# Patient Record
Sex: Female | Born: 1975 | Hispanic: Yes | Marital: Married
Health system: Midwestern US, Community
[De-identification: ages and names within clinical notes are randomized; demographics above are authoritative.]

## PROBLEM LIST (undated history)

## (undated) DIAGNOSIS — Z800 Family history of malignant neoplasm of digestive organs: Secondary | ICD-10-CM

## (undated) DIAGNOSIS — D649 Anemia, unspecified: Secondary | ICD-10-CM

## (undated) DIAGNOSIS — Z803 Family history of malignant neoplasm of breast: Secondary | ICD-10-CM

## (undated) DIAGNOSIS — Z8619 Personal history of other infectious and parasitic diseases: Secondary | ICD-10-CM

## (undated) DIAGNOSIS — Z8041 Family history of malignant neoplasm of ovary: Secondary | ICD-10-CM

## (undated) DIAGNOSIS — E039 Hypothyroidism, unspecified: Principal | ICD-10-CM

## (undated) DIAGNOSIS — D509 Iron deficiency anemia, unspecified: Principal | ICD-10-CM

## (undated) HISTORY — DX: Family history of malignant neoplasm of ovary: Z80.41

## (undated) HISTORY — DX: Personal history of other infectious and parasitic diseases: Z86.19

## (undated) HISTORY — PX: NO PAST SURGERIES: SHX2092

## (undated) HISTORY — DX: Anemia, unspecified: D64.9

## (undated) HISTORY — DX: Family history of malignant neoplasm of digestive organs: Z80.0

## (undated) HISTORY — DX: Family history of malignant neoplasm of breast: Z80.3

---

## 2010-02-24 ENCOUNTER — Inpatient Hospital Stay (HOSPITAL_COMMUNITY)
Admission: AD | Admit: 2010-02-24 | Discharge: 2010-02-26 | Payer: Self-pay | Source: Home / Self Care | Attending: Obstetrics & Gynecology | Admitting: Obstetrics & Gynecology

## 2010-05-21 LAB — CBC
HCT: 25.5 % — ABNORMAL LOW (ref 36.0–46.0)
HCT: 30.8 % — ABNORMAL LOW (ref 36.0–46.0)
MCH: 26.5 pg (ref 26.0–34.0)
MCH: 27.5 pg (ref 26.0–34.0)
MCHC: 31.8 g/dL (ref 30.0–36.0)
MCHC: 32.8 g/dL (ref 30.0–36.0)
RDW: 15 % (ref 11.5–15.5)
RDW: 15.1 % (ref 11.5–15.5)

## 2010-05-21 LAB — ABO/RH: ABO/RH(D): O POS

## 2010-05-21 LAB — RPR: RPR Ser Ql: NONREACTIVE

## 2012-07-10 ENCOUNTER — Telehealth: Payer: Self-pay | Admitting: Neurology

## 2012-07-22 ENCOUNTER — Encounter: Payer: Self-pay | Admitting: *Deleted

## 2012-08-06 ENCOUNTER — Ambulatory Visit: Payer: Self-pay | Admitting: Neurology

## 2013-02-08 ENCOUNTER — Encounter: Payer: Self-pay | Admitting: Gastroenterology

## 2013-02-10 ENCOUNTER — Encounter: Payer: Self-pay | Admitting: Gastroenterology

## 2013-02-10 ENCOUNTER — Other Ambulatory Visit (INDEPENDENT_AMBULATORY_CARE_PROVIDER_SITE_OTHER): Payer: 59

## 2013-02-10 ENCOUNTER — Ambulatory Visit (INDEPENDENT_AMBULATORY_CARE_PROVIDER_SITE_OTHER): Payer: 59 | Admitting: Gastroenterology

## 2013-02-10 VITALS — BP 100/66 | HR 78 | Ht 63.0 in | Wt 120.8 lb

## 2013-02-10 DIAGNOSIS — R1013 Epigastric pain: Secondary | ICD-10-CM

## 2013-02-10 DIAGNOSIS — R197 Diarrhea, unspecified: Secondary | ICD-10-CM

## 2013-02-10 DIAGNOSIS — Z800 Family history of malignant neoplasm of digestive organs: Secondary | ICD-10-CM

## 2013-02-10 DIAGNOSIS — K3189 Other diseases of stomach and duodenum: Secondary | ICD-10-CM

## 2013-02-10 DIAGNOSIS — R1033 Periumbilical pain: Secondary | ICD-10-CM

## 2013-02-10 LAB — HEPATIC FUNCTION PANEL
AST: 19 U/L (ref 0–37)
Albumin: 4.1 g/dL (ref 3.5–5.2)
Total Bilirubin: 0.4 mg/dL (ref 0.3–1.2)

## 2013-02-10 LAB — CBC WITH DIFFERENTIAL/PLATELET
Basophils Relative: 0.6 % (ref 0.0–3.0)
Eosinophils Relative: 2.5 % (ref 0.0–5.0)
HCT: 36.5 % (ref 36.0–46.0)
Hemoglobin: 12.2 g/dL (ref 12.0–15.0)
Lymphs Abs: 2.2 10*3/uL (ref 0.7–4.0)
MCV: 84.7 fl (ref 78.0–100.0)
Monocytes Absolute: 0.6 10*3/uL (ref 0.1–1.0)
Monocytes Relative: 6.9 % (ref 3.0–12.0)
Neutro Abs: 5.8 10*3/uL (ref 1.4–7.7)
RBC: 4.31 Mil/uL (ref 3.87–5.11)
WBC: 8.9 10*3/uL (ref 4.5–10.5)

## 2013-02-10 LAB — BASIC METABOLIC PANEL
BUN: 16 mg/dL (ref 6–23)
Calcium: 9.4 mg/dL (ref 8.4–10.5)
GFR: 141.92 mL/min (ref >60.00)
Potassium: 4 mEq/L (ref 3.5–5.1)

## 2013-02-10 MED ORDER — SACCHAROMYCES BOULARDII 250 MG PO CAPS: 250.0000 mg | ORAL_CAPSULE | Freq: Two times a day (BID) | ORAL | Status: DC

## 2013-02-10 MED ORDER — HYOSCYAMINE SULFATE 0.125 MG SL SUBL: SUBLINGUAL_TABLET | SUBLINGUAL | Status: DC

## 2013-02-10 NOTE — Progress Notes (Signed)
    History of Present Illness: This is a 37 year old female who relates a change in bowel habits with looser stools, bloating and mild abdominal cramping since August. She states she was treated with antibiotics for a urinary tract infection in August and her symptoms began after that. She has recently been taking a digestive enzyme which has helped her symptoms. She also has stopped drinking coffee which exacerbated her symptoms. She took Prilosec for several days but it led to dizziness and she discontinued it. Denies weight loss, constipation, change in stool caliber, melena, hematochezia, nausea, vomiting, dysphagia, reflux symptoms, chest pain.  Review of Systems: Pertinent positive and negative review of systems were noted in the above HPI section. All other review of systems were otherwise negative.  Current Medications, Allergies, Past Medical History, Past Surgical History, Family History and Social History were reviewed in [employer] record.  Physical Exam: General: Well developed , well nourished, no acute distress Head: Normocephalic and atraumatic Eyes:  sclerae anicteric, EOMI Ears: Normal auditory acuity Mouth: No deformity or lesions Neck: Supple, no masses or thyromegaly Lungs: Clear throughout to auscultation Heart: Regular rate and rhythm; no murmurs, rubs or bruits Abdomen: Soft, non tender and non distended. No masses, hepatosplenomegaly or hernias noted. Normal Bowel sounds Musculoskeletal: Symmetrical with no gross deformities  Skin: No lesions on visible extremities Pulses:  Normal pulses noted Extremities: No clubbing, cyanosis, edema or deformities noted Neurological: Alert oriented x 4, grossly nonfocal Cervical Nodes:  No significant cervical adenopathy Inguinal Nodes: No significant inguinal adenopathy Psychological:  Alert and cooperative. Normal mood and affect  Assessment and Recommendations:  1. Change in bowel habits, abdominal  bloating, looser stools. Rule out an intestinal infection, celiac disease, IBS. Obtain standard blood work, celiac panel, stool pathogen panel and Hemoccults. Trial of Florastor twice a day and Levsin 1-2 a.c. Return office visit in 10-14 days.  2. Family history of colon cancer, mother at age 57. Begin higher risk screening colonoscopies at age 33.

## 2013-02-10 NOTE — Patient Instructions (Signed)
Your physician has requested that you go to the basement for the following lab work before leaving today: [employer], Celiac panel, GI pathogen panel.   Start Florastor samples one tablet by mouth twice daily until finished.   We have sent the following medications to your pharmacy for you to pick up at your convenience: Levsin.  Follow instructions on Hemoccult cards and mail them back to [place] when finished.   Thank you for choosing me and [facility].  Venita Lick. Pleas Koch., MD., Clementeen Graham

## 2013-02-11 LAB — CELIAC PANEL 10
Gliadin IgA: 5.4 U/mL (ref <20)
Gliadin IgG: 15.6 U/mL (ref <20)
Tissue Transglutaminase Ab, IgA: 10.3 U/mL (ref <20)

## 2013-02-17 ENCOUNTER — Other Ambulatory Visit: Payer: Self-pay | Admitting: Gastroenterology

## 2013-02-17 ENCOUNTER — Other Ambulatory Visit: Payer: 59

## 2013-02-17 DIAGNOSIS — R197 Diarrhea, unspecified: Secondary | ICD-10-CM

## 2013-02-17 DIAGNOSIS — Z800 Family history of malignant neoplasm of digestive organs: Secondary | ICD-10-CM

## 2013-02-18 LAB — GASTROINTESTINAL PATHOGEN PANEL PCR
C. difficile Tox A/B, PCR: NEGATIVE
Campylobacter, PCR: NEGATIVE
E coli (STEC) stx1/stx2, PCR: NEGATIVE
Giardia lamblia, PCR: NEGATIVE
Norovirus, PCR: NEGATIVE
Rotavirus A, PCR: NEGATIVE

## 2013-02-24 ENCOUNTER — Ambulatory Visit: Payer: 59 | Admitting: Gastroenterology

## 2013-03-05 ENCOUNTER — Other Ambulatory Visit (INDEPENDENT_AMBULATORY_CARE_PROVIDER_SITE_OTHER): Payer: 59

## 2013-03-05 DIAGNOSIS — Z800 Family history of malignant neoplasm of digestive organs: Secondary | ICD-10-CM

## 2013-03-05 DIAGNOSIS — R197 Diarrhea, unspecified: Secondary | ICD-10-CM

## 2013-03-05 LAB — HEMOCCULT SLIDES (X 3 CARDS)
OCCULT 1: NEGATIVE
OCCULT 2: NEGATIVE
OCCULT 3: NEGATIVE
OCCULT 4: NEGATIVE

## 2013-03-15 ENCOUNTER — Encounter: Payer: Self-pay | Admitting: Gastroenterology

## 2013-03-15 ENCOUNTER — Ambulatory Visit (INDEPENDENT_AMBULATORY_CARE_PROVIDER_SITE_OTHER): Payer: 59 | Admitting: Gastroenterology

## 2013-03-15 VITALS — BP 100/60 | HR 72 | Ht 63.0 in | Wt 121.2 lb

## 2013-03-15 DIAGNOSIS — R1013 Epigastric pain: Secondary | ICD-10-CM

## 2013-03-15 MED ORDER — PANTOPRAZOLE SODIUM 20 MG PO TBEC: 20.0000 mg | DELAYED_RELEASE_TABLET | Freq: Every day | ORAL | Status: DC

## 2013-03-15 NOTE — Patient Instructions (Signed)
We have sent the following medications to your pharmacy for you to pick up at your convenience:  Pantoprazole  Thank you for choosing me and [facility].  Malcolm T. Stark, Jr., MD., FACG  

## 2013-03-15 NOTE — Progress Notes (Signed)
    History of Present Illness: This is a 38 year old female who has noted improvement in her abdominal bloating, abdominal cramping and looser stools with the use of digestive enzymes and Florastor. Blood work, stool pathogen panel and stool Hemoccults all unremarkable except for a mildly elevated glucose at 121 and a slightly elevated platelet count at 457k. She takes hyoscyamine sparingly as it leads to slight blurriness in her vision and she is concerned it may trigger a migraine. She took omeprazole for 2 days but discontinued it due to feelings of anxiety that she attributed to omeprazole. She relates mild epigastric burning and nausea generally in the mornings but sometimes occurring after meals.  Current Medications, Allergies, Past Medical History, Past Surgical History, Family History and Social History were reviewed in [facility] electronic medical record.  Physical Exam: General: Well developed , well nourished, no acute distress Head: Normocephalic and atraumatic Eyes:  sclerae anicteric, EOMI Ears: Normal auditory acuity Mouth: No deformity or lesions Lungs: Clear throughout to auscultation Heart: Regular rate and rhythm; no murmurs, rubs or bruits Abdomen: Soft, mild epigastric tenderness without rebound or guarding and non distended. No masses, hepatosplenomegaly or hernias noted. Normal Bowel sounds Musculoskeletal: Symmetrical with no gross deformities  Pulses:  Normal pulses noted Extremities: No clubbing, cyanosis, edema or deformities noted Neurological: Alert oriented x 4, grossly nonfocal Psychological:  Alert and cooperative. Normal mood and affect  Assessment and Recommendations:  1. Mild epigastric pain and nausea. Begin pantoprazole 20 mg daily. If she has side effects with pantoprazole will  try another PPI or ranitidine.  2. Mild abdominal cramping, bloating and looser stools have essentially resolved. Continue digestive enzymes and Florastor. Hyoscyamine  when necessary.

## 2013-04-28 ENCOUNTER — Ambulatory Visit: Payer: 59 | Admitting: Gastroenterology

## 2013-05-12 ENCOUNTER — Encounter: Payer: Self-pay | Admitting: Gastroenterology

## 2013-05-12 ENCOUNTER — Ambulatory Visit (INDEPENDENT_AMBULATORY_CARE_PROVIDER_SITE_OTHER): Payer: 59 | Admitting: Gastroenterology

## 2013-05-12 VITALS — BP 90/64 | HR 80 | Ht 63.0 in | Wt 122.0 lb

## 2013-05-12 DIAGNOSIS — R1013 Epigastric pain: Secondary | ICD-10-CM

## 2013-05-12 NOTE — Progress Notes (Signed)
    History of Present Illness: This is a 38 year old female returning for followup of epigastric pain. Her symptoms have substantially improved except for occasional epigastric pain and urgent stools with certain foods. She could not tolerate hyoscyamine due to headaches.  Current Medications, Allergies, Past Medical History, Past Surgical History, Family History and Social History were reviewed in [facility] electronic medical record.  Physical Exam: General: Well developed , well nourished, no acute distress Head: Normocephalic and atraumatic Eyes:  sclerae anicteric, EOMI Ears: Normal auditory acuity Mouth: No deformity or lesions Lungs: Clear throughout to auscultation Heart: Regular rate and rhythm; no murmurs, rubs or bruits Abdomen: Soft, non tender and non distended. No masses, hepatosplenomegaly or hernias noted. Normal Bowel sounds Musculoskeletal: Symmetrical with no gross deformities  Extremities: No clubbing, cyanosis, edema or deformities noted Neurological: Alert oriented x 4, grossly nonfocal Psychological:  Alert and cooperative. Normal mood and affect  Assessment and Recommendations:  1. Mild epigastric pain cramping, bloating, looser stoolsand nausea. All symptoms are substantially improved. Continue pantoprazole 20 mg daily for 2 weeks and then taper to DC as below. Continue OTC digestive enzyme 3 times a day with meals as needed and continue Florastor. DC hyoscyamine as it leads to headaches.

## 2013-05-12 NOTE — Patient Instructions (Signed)
Continue pantoprazole for 2 more weeks, then decrease to every other day x 1 week, then reduce to every 3rd x 1 week, then reduce to every 4th day x 1 week, then discontinue.  Thank you for choosing me and [facility].  Venita LickMalcolm T. Pleas KochStark, Jr., MD., Clementeen GrahamFACG

## 2014-08-05 ENCOUNTER — Other Ambulatory Visit: Payer: Self-pay | Admitting: Neurology

## 2014-08-05 ENCOUNTER — Ambulatory Visit
Admission: RE | Admit: 2014-08-05 | Discharge: 2014-08-05 | Disposition: A | Discharge status: Home / Self Care | Payer: 59 | Source: Ambulatory Visit | Attending: Neurology | Admitting: Neurology

## 2014-08-05 DIAGNOSIS — M542 Cervicalgia: Secondary | ICD-10-CM

## 2014-08-05 IMAGING — CR DG CERVICAL SPINE COMPLETE 4+V
6 series · 6 of 6 positions shown · non-contrast
Comparison: None.

CLINICAL DATA: Skullbase tingling sensation with headaches.
Associated neck pain.

EXAM:
CERVICAL SPINE  4+ VIEWS

[w cervical spine lat (1 of 3)]
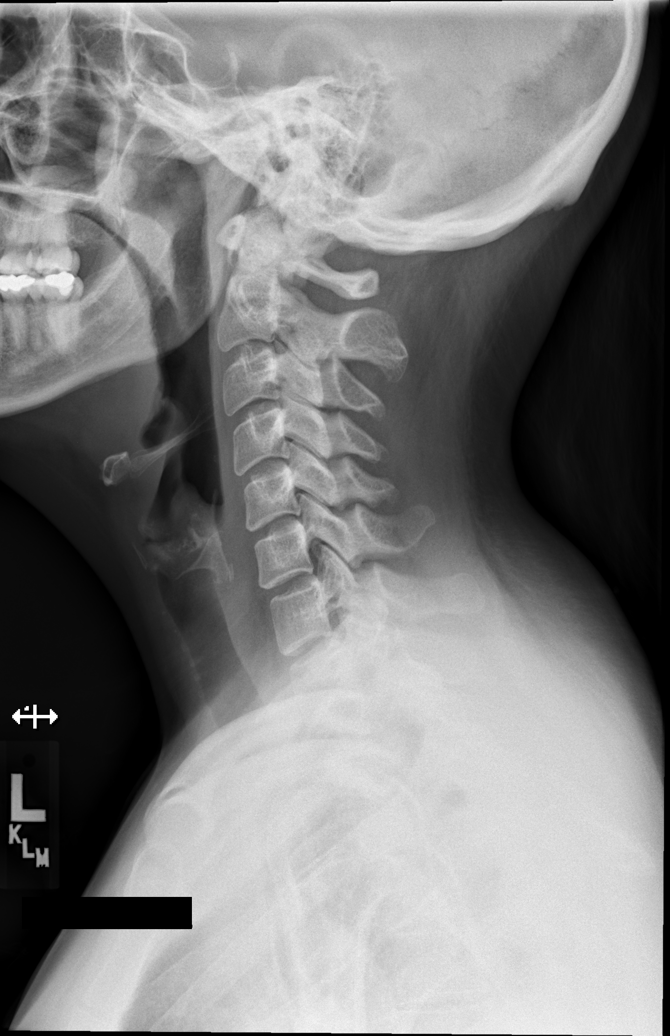

[w cervical spine lat (2 of 3)]
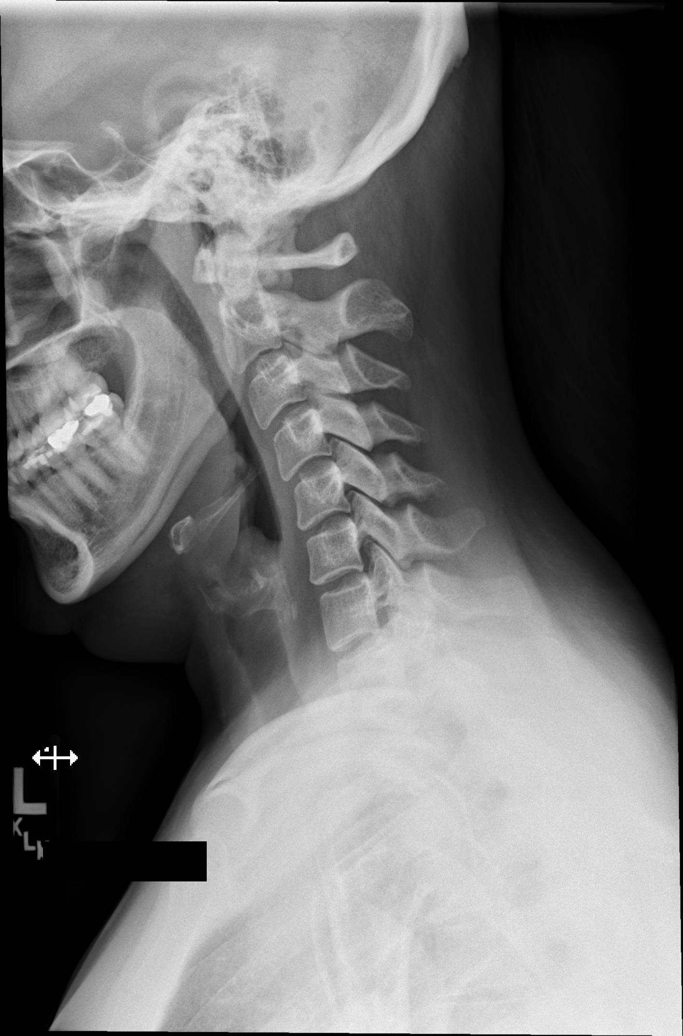

[w cervical spine lat (3 of 3)]
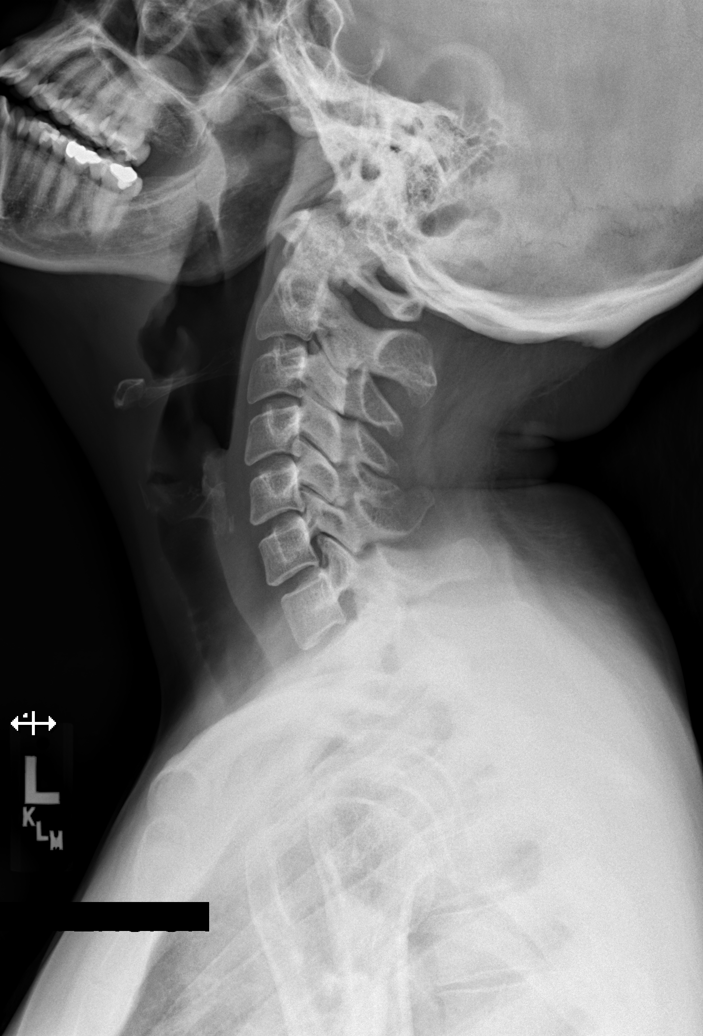

[w cervical spine ap (1 of 2)]
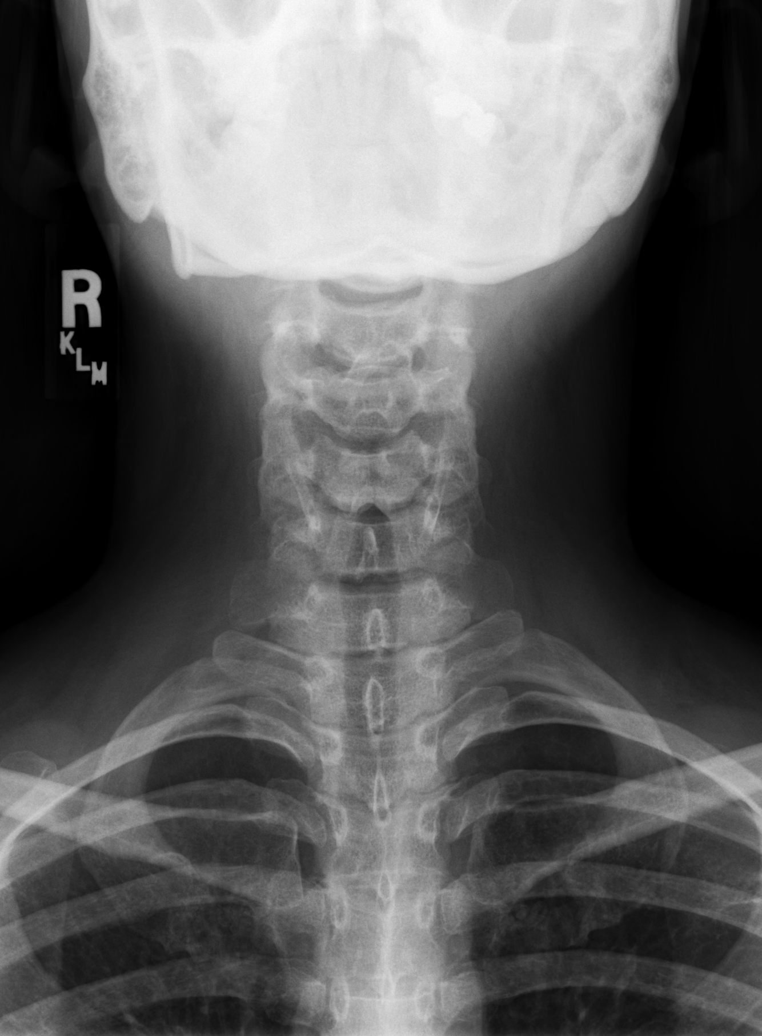

[w cervical spine odontoid]
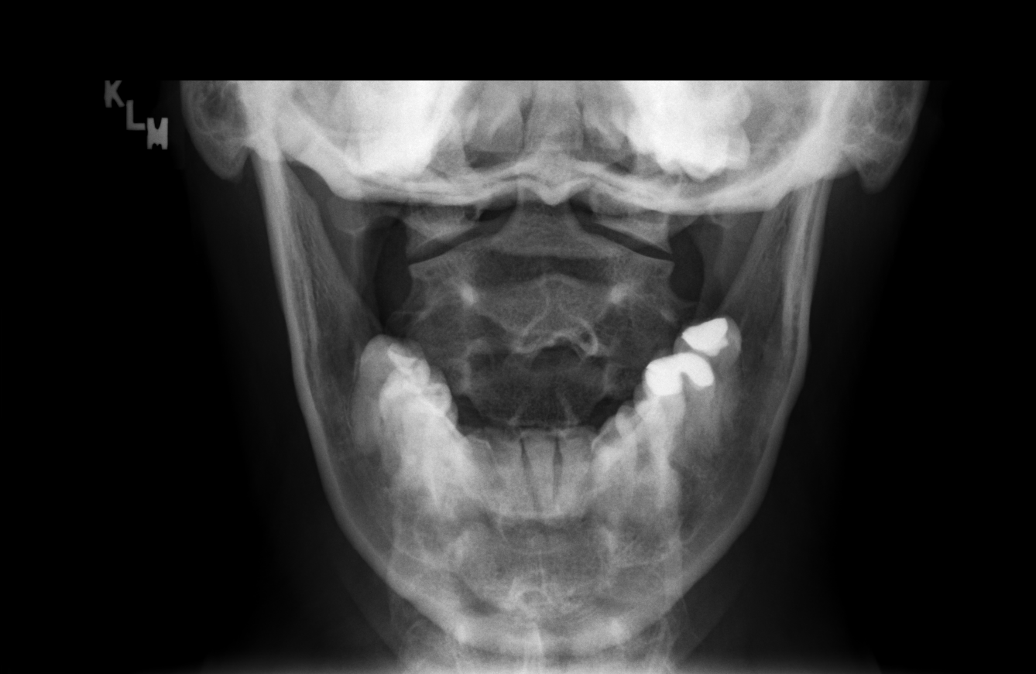

[w cervical spine ap (2 of 2)]
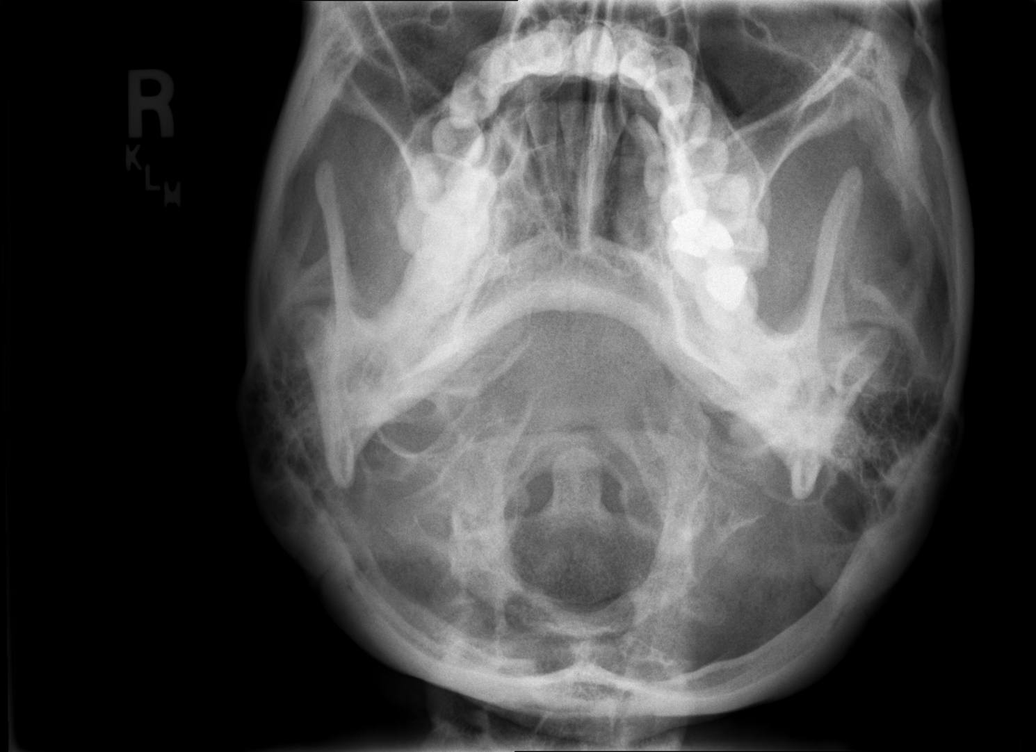

[6 of 6 positions shown; findings below may reference images not displayed]

FINDINGS: There is no evidence of cervical spine fracture or prevertebral soft
tissue swelling. Alignment is normal. No other significant bone
abnormalities are identified. No instability with flexion or
extension.
IMPRESSION: Negative cervical spine radiographs.

## 2015-01-10 ENCOUNTER — Ambulatory Visit
Admission: RE | Admit: 2015-01-10 | Discharge: 2015-01-10 | Disposition: A | Discharge status: Home / Self Care | Payer: 59 | Source: Ambulatory Visit | Attending: Physical Medicine and Rehabilitation | Admitting: Physical Medicine and Rehabilitation

## 2015-01-10 ENCOUNTER — Other Ambulatory Visit: Payer: Self-pay | Admitting: Physical Medicine and Rehabilitation

## 2015-01-10 DIAGNOSIS — M542 Cervicalgia: Secondary | ICD-10-CM

## 2015-01-10 IMAGING — CR DG CERVICAL SPINE 2 OR 3 VIEWS
2 series · 2 of 2 positions shown · non-contrast
Comparison: Cervical spine series of [DATE]

CLINICAL DATA: New onset of posterior neck pain over the past 2
weeks without known injury

EXAM:
CERVICAL SPINE - 2-3 VIEW

[w c-spine lat]
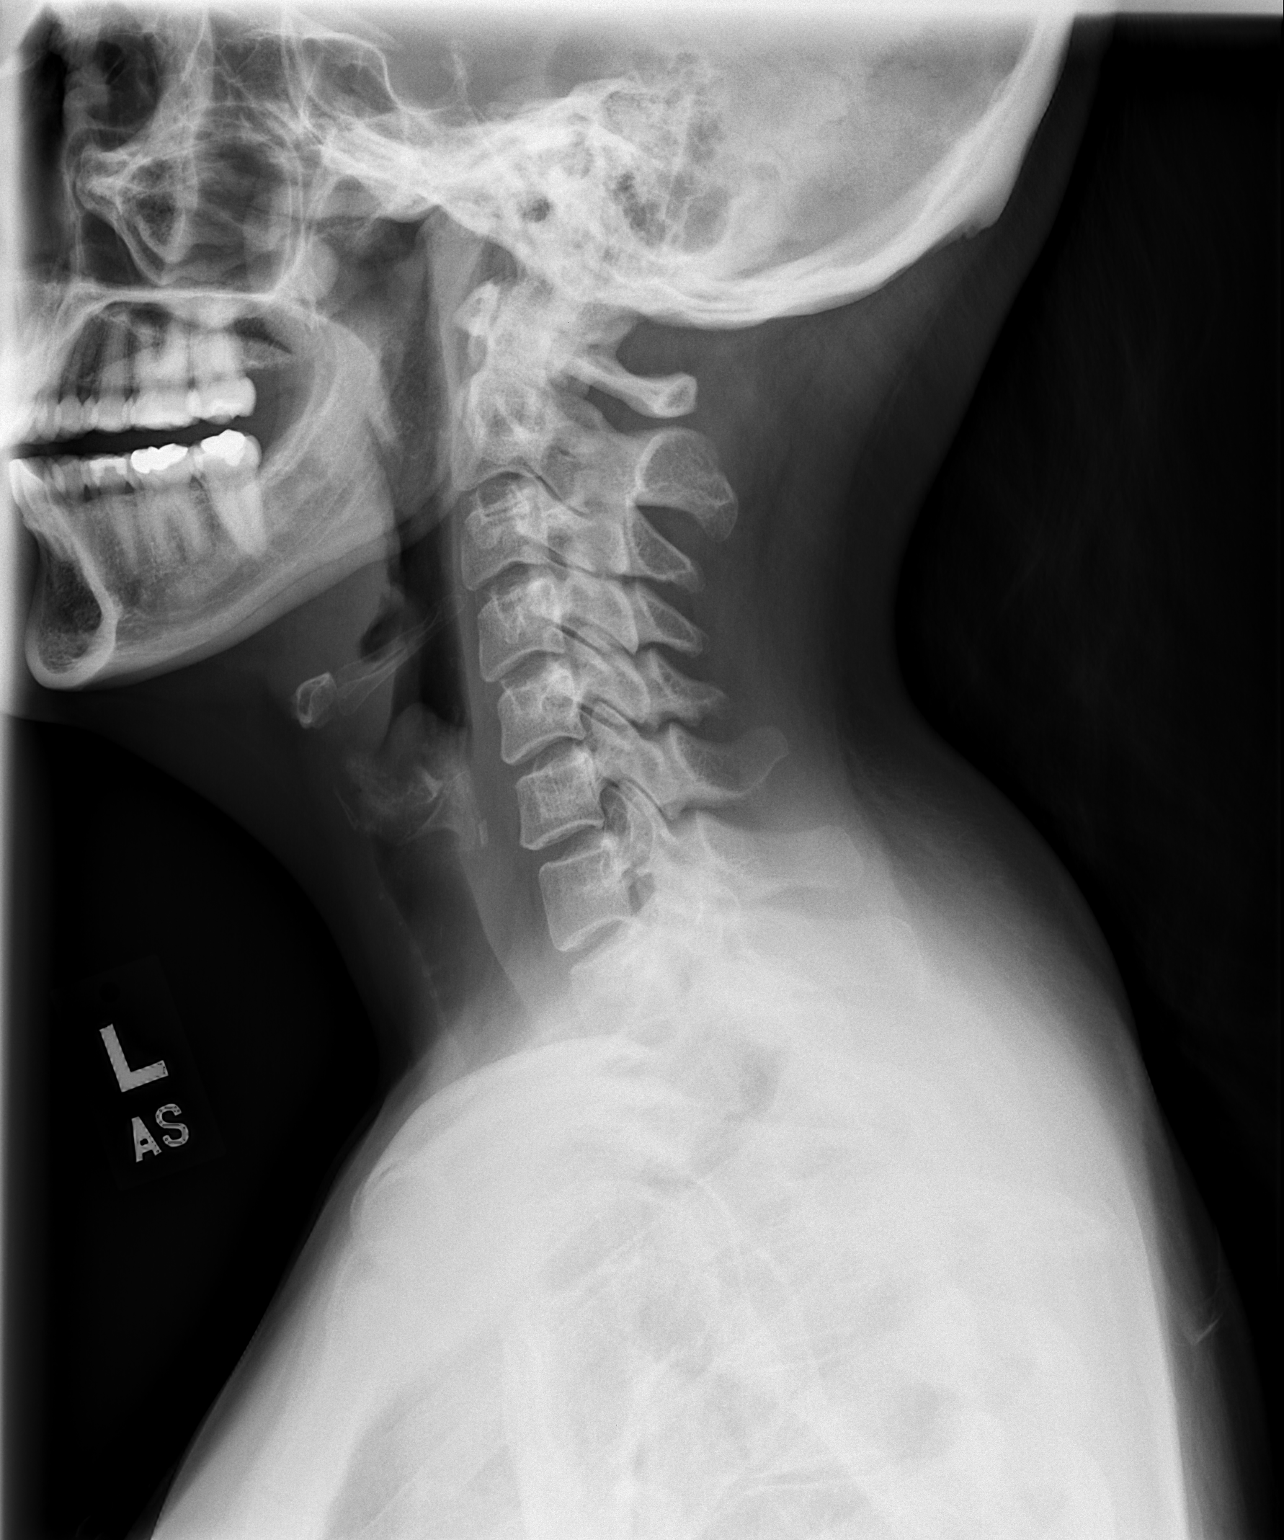

[w c-spine a.p. *]
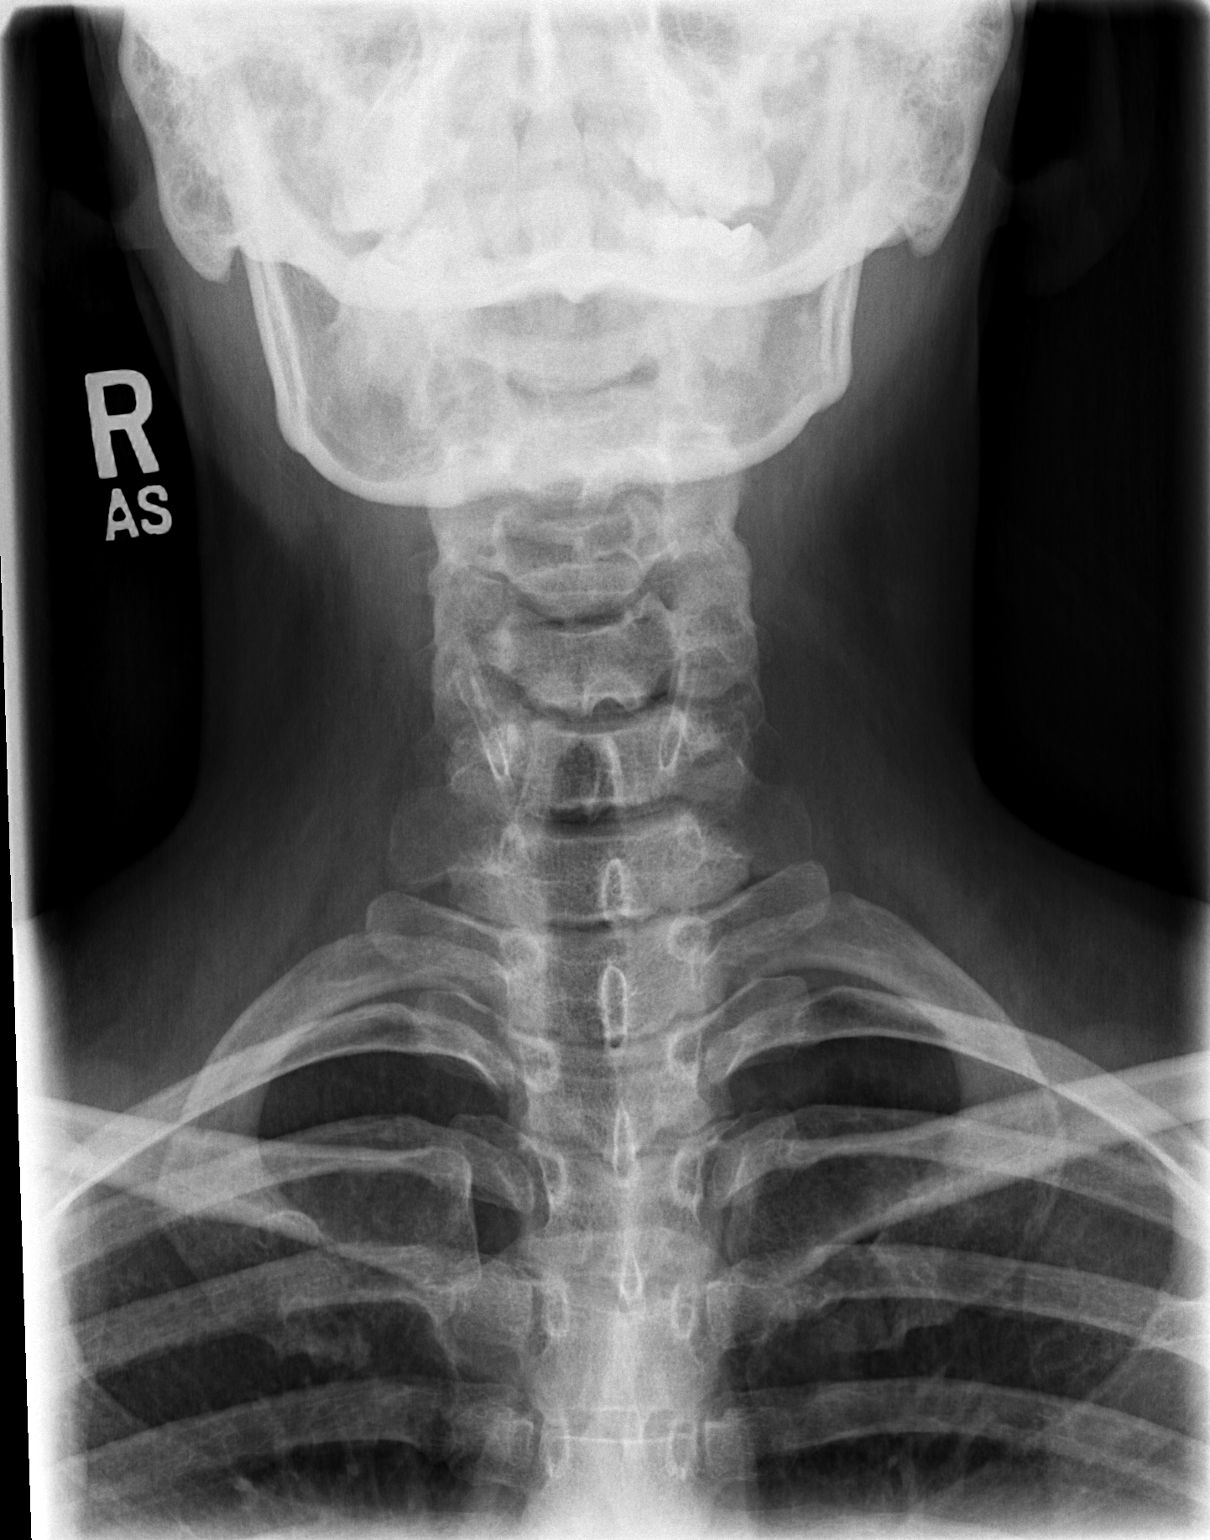

[2 of 2 positions shown; findings below may reference images not displayed]

FINDINGS: The cervical vertebral bodies are preserved in height. The disc
space heights are well maintained. There is no perched facet and no
spinous process fracture. The prevertebral soft tissue spaces are
normal.
IMPRESSION: There is no acute or significant chronic bony abnormality of the
cervical spine.

## 2015-03-21 ENCOUNTER — Ambulatory Visit: Payer: 59 | Admitting: Gastroenterology

## 2015-03-21 ENCOUNTER — Ambulatory Visit (INDEPENDENT_AMBULATORY_CARE_PROVIDER_SITE_OTHER): Payer: 59 | Admitting: Gastroenterology

## 2015-03-21 ENCOUNTER — Encounter: Payer: Self-pay | Admitting: Gastroenterology

## 2015-03-21 VITALS — BP 106/64 | HR 80 | Ht 62.6 in | Wt 125.1 lb

## 2015-03-21 DIAGNOSIS — K219 Gastro-esophageal reflux disease without esophagitis: Secondary | ICD-10-CM

## 2015-03-21 DIAGNOSIS — R0789 Other chest pain: Secondary | ICD-10-CM | POA: Diagnosis not present

## 2015-03-21 DIAGNOSIS — R1013 Epigastric pain: Secondary | ICD-10-CM | POA: Diagnosis not present

## 2015-03-21 DIAGNOSIS — Z800 Family history of malignant neoplasm of digestive organs: Secondary | ICD-10-CM | POA: Diagnosis not present

## 2015-03-21 MED ORDER — OMEPRAZOLE 20 MG PO CPDR: 20.0000 mg | DELAYED_RELEASE_CAPSULE | Freq: Every day | ORAL | Status: DC

## 2015-03-21 NOTE — Progress Notes (Signed)
    History of Present Illness: This is a 40 year old female who relates postprandial heartburn and substernal chest pressure occurring intermittently as well as epigastric discomfort following meals. She also has ongoing problems with left lower chest pain exacerbated by movement and palpation. This pain is present anteriorly, laterally and posteriorly. It also worsens when her postprandial symptoms occur. She had similar GI symptoms when I last saw her that responded to acid supression and a digestive enzyme supplement. Bowel movements are normal. No bleeding, constipation or diarrhea. Appetite is good and weight is stable. She has taken Tums and Zantac as needed with temporary improvement in her symptoms epigastric and substernal chest symptoms.   Current Medications, Allergies, Past Medical History, Past Surgical History, Family History and Social History were reviewed in [facility] electronic medical record.  Physical Exam: General: Well developed, well nourished, no acute distress Head: Normocephalic and atraumatic Eyes:  sclerae anicteric, EOMI Ears: Normal auditory acuity Mouth: No deformity or lesions Lungs: Clear throughout to auscultation. Lower chest wall tender to palpation Heart: Regular rate and rhythm; no murmurs, rubs or bruits Abdomen: Soft, non tender and non distended. No masses, hepatosplenomegaly or hernias noted. Normal Bowel sounds Musculoskeletal: Symmetrical with no gross deformities  Pulses:  Normal pulses noted Extremities: No clubbing, cyanosis, edema or deformities noted Neurological: Alert oriented x 4, grossly nonfocal Psychological:  Alert and cooperative. Normal mood and affect  Assessment and Recommendations:  1. Left chest wall pain and tenderness. Advil leads to dyspeptic symptoms. Tylenol as directed. Follow-up with PCP for further evaluation and mgmt.  2. GERD, epigastric pain and dyspepsia. Begin omeprazole 20 mg daily. Antireflux measures. REV in  one month. If her symptoms do not adequately respond consider abdominal ultrasound and EGD  3. Family history and her mother at age 38. Begin higher risk screening colonoscopies at age 40.

## 2015-03-21 NOTE — Patient Instructions (Signed)
We have sent the following medications to your pharmacy for you to pick up at your convenience: Omeprazole 20 mg daily  You may take over the counter Tylenol as needed for left sided chest pain.  Please follow up with Dr Russella DarStark on Tuesday, 04/25/15 @ 3:45 pm.

## 2015-04-25 ENCOUNTER — Ambulatory Visit: Payer: 59 | Admitting: Gastroenterology

## 2015-06-14 ENCOUNTER — Encounter: Payer: Self-pay | Admitting: Gastroenterology

## 2015-06-14 ENCOUNTER — Ambulatory Visit (INDEPENDENT_AMBULATORY_CARE_PROVIDER_SITE_OTHER): Payer: 59 | Admitting: Gastroenterology

## 2015-06-14 VITALS — BP 90/60 | HR 77 | Ht 63.0 in | Wt 125.0 lb

## 2015-06-14 DIAGNOSIS — R194 Change in bowel habit: Secondary | ICD-10-CM

## 2015-06-14 DIAGNOSIS — K219 Gastro-esophageal reflux disease without esophagitis: Secondary | ICD-10-CM

## 2015-06-14 DIAGNOSIS — R140 Abdominal distension (gaseous): Secondary | ICD-10-CM | POA: Diagnosis not present

## 2015-06-14 MED ORDER — DICYCLOMINE HCL 10 MG PO CAPS: 10.0000 mg | ORAL_CAPSULE | Freq: Two times a day (BID) | ORAL | Status: DC | PRN

## 2015-06-14 NOTE — Patient Instructions (Addendum)
We have sent the following medications to your pharmacy for you to pick up at your convenience:Bentyl.  You can take over the counter Gas-x four times a day as needed for gas and bloating.  Thank you for choosing me and [facility].  Venita LickMalcolm T. Pleas KochStark, Jr., MD., Clementeen GrahamFACG

## 2015-06-14 NOTE — Progress Notes (Signed)
    History of Present Illness: This is a 40 year old female who relates improvement in her reflux symptoms and epigastric pain taking omeprazole and following antireflux measures. She relates postprandial generalized abdominal bloating and gas following her evening meal. She has noted episodes of looser bowel movements occurring 2-3 times a day about a few occasions since I last saw her.  Current Medications, Allergies, Past Medical History, Past Surgical History, Family History and Social History were reviewed in [facility] electronic medical record.  Physical Exam: General: Well developed, well nourished, no acute distress Head: Normocephalic and atraumatic Eyes:  sclerae anicteric, EOMI Ears: Normal auditory acuity Mouth: No deformity or lesions Lungs: Clear throughout to auscultation Heart: Regular rate and rhythm; no murmurs, rubs or bruits Abdomen: Soft, non tender and non distended. No masses, hepatosplenomegaly or hernias noted. Normal Bowel sounds Musculoskeletal: Symmetrical with no gross deformities  Pulses:  Normal pulses noted Extremities: No clubbing, cyanosis, edema or deformities noted Neurological: Alert oriented x 4, grossly nonfocal Psychological:  Alert and cooperative. Normal mood and affect  Assessment and Recommendations:  1. GERD, epigastric pain, bloating, gas, bowel habit variation. Possible IBS. Continue omeprazole 20 mg daily and antireflux measures. Trial of dicyclomine 10 mg tid prn and Gas-X qid prn. If her symptoms do not adequately respond consider abdominal ultrasound and EGD. REV in 2 months.  2. Family history and her mother at age 965. Begin higher risk screening colonoscopies at age 40.

## 2015-08-16 ENCOUNTER — Ambulatory Visit: Payer: 59 | Admitting: Gastroenterology

## 2016-03-14 DIAGNOSIS — M545 Low back pain: Secondary | ICD-10-CM | POA: Diagnosis not present

## 2016-03-19 DIAGNOSIS — M545 Low back pain: Secondary | ICD-10-CM | POA: Diagnosis not present

## 2016-03-21 DIAGNOSIS — M545 Low back pain: Secondary | ICD-10-CM | POA: Diagnosis not present

## 2016-03-26 DIAGNOSIS — M545 Low back pain: Secondary | ICD-10-CM | POA: Diagnosis not present

## 2016-04-02 DIAGNOSIS — M545 Low back pain: Secondary | ICD-10-CM | POA: Diagnosis not present

## 2016-04-04 DIAGNOSIS — D509 Iron deficiency anemia, unspecified: Secondary | ICD-10-CM | POA: Diagnosis not present

## 2016-04-04 DIAGNOSIS — Z0000 Encounter for general adult medical examination without abnormal findings: Secondary | ICD-10-CM | POA: Diagnosis not present

## 2016-04-05 DIAGNOSIS — M545 Low back pain: Secondary | ICD-10-CM | POA: Diagnosis not present

## 2016-04-10 DIAGNOSIS — M545 Low back pain: Secondary | ICD-10-CM | POA: Diagnosis not present

## 2016-04-12 DIAGNOSIS — M545 Low back pain: Secondary | ICD-10-CM | POA: Diagnosis not present

## 2016-04-17 DIAGNOSIS — M545 Low back pain: Secondary | ICD-10-CM | POA: Diagnosis not present

## 2016-04-19 DIAGNOSIS — M545 Low back pain: Secondary | ICD-10-CM | POA: Diagnosis not present

## 2016-05-09 DIAGNOSIS — R899 Unspecified abnormal finding in specimens from other organs, systems and tissues: Secondary | ICD-10-CM | POA: Diagnosis not present

## 2016-05-09 DIAGNOSIS — Z0000 Encounter for general adult medical examination without abnormal findings: Secondary | ICD-10-CM | POA: Diagnosis not present

## 2016-05-09 DIAGNOSIS — D509 Iron deficiency anemia, unspecified: Secondary | ICD-10-CM | POA: Diagnosis not present

## 2016-05-15 DIAGNOSIS — K5900 Constipation, unspecified: Secondary | ICD-10-CM | POA: Diagnosis not present

## 2016-06-20 DIAGNOSIS — S86112A Strain of other muscle(s) and tendon(s) of posterior muscle group at lower leg level, left leg, initial encounter: Secondary | ICD-10-CM | POA: Diagnosis not present

## 2016-08-21 DIAGNOSIS — Z118 Encounter for screening for other infectious and parasitic diseases: Secondary | ICD-10-CM | POA: Diagnosis not present

## 2016-08-21 DIAGNOSIS — Z1159 Encounter for screening for other viral diseases: Secondary | ICD-10-CM | POA: Diagnosis not present

## 2016-08-21 DIAGNOSIS — D649 Anemia, unspecified: Secondary | ICD-10-CM | POA: Diagnosis not present

## 2016-10-03 DIAGNOSIS — K5900 Constipation, unspecified: Secondary | ICD-10-CM | POA: Diagnosis not present

## 2016-12-10 DIAGNOSIS — M7062 Trochanteric bursitis, left hip: Secondary | ICD-10-CM | POA: Diagnosis not present

## 2016-12-10 DIAGNOSIS — M545 Low back pain: Secondary | ICD-10-CM | POA: Diagnosis not present

## 2016-12-10 DIAGNOSIS — M76892 Other specified enthesopathies of left lower limb, excluding foot: Secondary | ICD-10-CM | POA: Diagnosis not present

## 2017-01-16 DIAGNOSIS — Z23 Encounter for immunization: Secondary | ICD-10-CM | POA: Diagnosis not present

## 2017-02-20 DIAGNOSIS — H40013 Open angle with borderline findings, low risk, bilateral: Secondary | ICD-10-CM | POA: Diagnosis not present

## 2017-04-08 DIAGNOSIS — Z0000 Encounter for general adult medical examination without abnormal findings: Secondary | ICD-10-CM | POA: Diagnosis not present

## 2017-04-08 DIAGNOSIS — D509 Iron deficiency anemia, unspecified: Secondary | ICD-10-CM | POA: Diagnosis not present

## 2017-04-15 ENCOUNTER — Encounter: Payer: Self-pay | Admitting: Gastroenterology

## 2017-04-17 ENCOUNTER — Encounter: Payer: Self-pay | Admitting: Gastroenterology

## 2017-04-17 ENCOUNTER — Ambulatory Visit: Payer: 59 | Admitting: Gastroenterology

## 2017-04-17 ENCOUNTER — Encounter (INDEPENDENT_AMBULATORY_CARE_PROVIDER_SITE_OTHER): Payer: Self-pay

## 2017-04-17 VITALS — BP 108/62 | HR 70 | Ht 62.0 in | Wt 119.0 lb

## 2017-04-17 DIAGNOSIS — Z800 Family history of malignant neoplasm of digestive organs: Secondary | ICD-10-CM

## 2017-04-17 DIAGNOSIS — K6289 Other specified diseases of anus and rectum: Secondary | ICD-10-CM

## 2017-04-17 DIAGNOSIS — K5904 Chronic idiopathic constipation: Secondary | ICD-10-CM | POA: Diagnosis not present

## 2017-04-17 MED ORDER — NA SULFATE-K SULFATE-MG SULF 17.5-3.13-1.6 GM/177ML PO SOLN: 1.0000 | Freq: Once | ORAL | 0 refills | Status: AC

## 2017-04-17 NOTE — Progress Notes (Signed)
    History of Present Illness: This is a 42 year old female with rectal swelling, rectal discomfort and occasional urgency.  She states for the past 2-3 months she has a sense of swelling discomfort and something protruding after a bowel movement.   She states for the past several weeks the symptoms have been present constantly.  They temporarily improved with the use of a hydrocortisone rectal cream.  She frequently has small pellet-like stools. She denies rectal bleeding or change in bowel habits.  Her mother was diagnosed with colon cancer in her mid 8060s. Denies weight loss, abdominal pain, constipation, diarrhea, change in stool caliber, melena, hematochezia, nausea, vomiting, dysphagia, reflux symptoms, chest pain.  Current Medications, Allergies, Past Medical History, Past Surgical History, Family History and Social History were reviewed in [facility] electronic medical record.  Physical Exam: General: Well developed, well nourished, no acute distress Head: Normocephalic and atraumatic Eyes:  sclerae anicteric, EOMI Ears: Normal auditory acuity Mouth: No deformity or lesions Lungs: Clear throughout to auscultation Heart: Regular rate and rhythm; no murmurs, rubs or bruits Abdomen: Soft, non tender and non distended. No masses, hepatosplenomegaly or hernias noted. Normal Bowel sounds Rectal: no lesions, mild tenderness, no stool Musculoskeletal: Symmetrical with no gross deformities  Pulses:  Normal pulses noted Extremities: No clubbing, cyanosis, edema or deformities noted Neurological: Alert oriented x 4, grossly nonfocal Psychological:  Alert and cooperative. Normal mood and affect  Assessment and Recommendations:  1.  Rectal swelling and discomfort.  Suspect internal hemorrhoids.  Rule out neoplasms.  Begin Preparation H suppositories nightly for 1 week and then as needed.  Schedule colonoscopy. The risks (including bleeding, perforation, infection, missed lesions, medication  reactions and possible hospitalization or surgery if complications occur), benefits, and alternatives to colonoscopy with possible biopsy and possible polypectomy were discussed with the patient and they consent to proceed.   2. Family history of colon cancer in her mother.  Colonoscopy as above.  3.  Constipation with small pellet-like stools.  Increase dietary fiber and water intake.  If this is not effective begin a stool softener.

## 2017-04-17 NOTE — Patient Instructions (Signed)
Increase your water and fiber intake in your diet.   You can use preparation H suppositories over the counter daily x 1 week then as needed.   You have been scheduled for a colonoscopy. Please follow written instructions given to you at your visit today.  Please pick up your prep supplies at the pharmacy within the next 1-3 days. If you use inhalers (even only as needed), please bring them with you on the day of your procedure. Your physician has requested that you go to www.startemmi.com and enter the access code given to you at your visit today. This web site gives a general overview about your procedure. However, you should still follow specific instructions given to you by our office regarding your preparation for the procedure.  Thank you for choosing me and  [facility].  Venita LickMalcolm T. Pleas KochStark, Jr., MD., Clementeen GrahamFACG

## 2017-05-09 DIAGNOSIS — L298 Other pruritus: Secondary | ICD-10-CM | POA: Diagnosis not present

## 2017-05-22 ENCOUNTER — Telehealth: Payer: Self-pay | Admitting: Gastroenterology

## 2017-05-22 NOTE — Telephone Encounter (Signed)
Tama HighHey John what do you think about patient question?

## 2017-05-23 NOTE — Telephone Encounter (Signed)
Sheri,  [facility]  Thanks,  Cathlyn ParsonsJohn Brelynn Wheller

## 2017-05-30 ENCOUNTER — Ambulatory Visit (AMBULATORY_SURGERY_CENTER): Payer: 59 | Admitting: Gastroenterology

## 2017-05-30 ENCOUNTER — Other Ambulatory Visit: Payer: Self-pay

## 2017-05-30 ENCOUNTER — Encounter: Payer: Self-pay | Admitting: Gastroenterology

## 2017-05-30 VITALS — BP 98/64 | HR 61 | Temp 99.6°F | Resp 10 | Ht 62.0 in | Wt 119.0 lb

## 2017-05-30 DIAGNOSIS — Z800 Family history of malignant neoplasm of digestive organs: Secondary | ICD-10-CM | POA: Diagnosis present

## 2017-05-30 DIAGNOSIS — Z1211 Encounter for screening for malignant neoplasm of colon: Secondary | ICD-10-CM | POA: Diagnosis not present

## 2017-05-30 DIAGNOSIS — Z1212 Encounter for screening for malignant neoplasm of rectum: Secondary | ICD-10-CM | POA: Diagnosis not present

## 2017-05-30 MED ORDER — SODIUM CHLORIDE 0.9 % IV SOLN: 500.0000 mL | Freq: Once | INTRAVENOUS | Status: DC

## 2017-05-30 NOTE — Progress Notes (Signed)
Called to room to assist during endoscopic procedure.  Patient ID and intended procedure confirmed with present staff. Received instructions for my participation in the procedure from the performing physician.  

## 2017-05-30 NOTE — Progress Notes (Signed)
To recovery, report to rn,VSS

## 2017-05-30 NOTE — Patient Instructions (Signed)
Impression/Recommendations:  Repeat colonoscopy in 5 years for screening purposes.  Resume previous diet. Continue present medications.  YOU HAD AN ENDOSCOPIC PROCEDURE TODAY AT THE [facility]:   Refer to the procedure report that was given to you for any specific questions about what was found during the examination.  If the procedure report does not answer your questions, please call your gastroenterologist to clarify.  If you requested that your care partner not be given the details of your procedure findings, then the procedure report has been included in a sealed envelope for you to review at your convenience later.  YOU SHOULD EXPECT: Some feelings of bloating in the abdomen. Passage of more gas than usual.  Walking can help get rid of the air that was put into your GI tract during the procedure and reduce the bloating. If you had a lower endoscopy (such as a colonoscopy or flexible sigmoidoscopy) you may notice spotting of blood in your stool or on the toilet paper. If you underwent a bowel prep for your procedure, you may not have a normal bowel movement for a few days.  Please Note:  You might notice some irritation and congestion in your nose or some drainage.  This is from the oxygen used during your procedure.  There is no need for concern and it should clear up in a day or so.  SYMPTOMS TO REPORT IMMEDIATELY:   Following lower endoscopy (colonoscopy or flexible sigmoidoscopy):  Excessive amounts of blood in the stool  Significant tenderness or worsening of abdominal pains  Swelling of the abdomen that is new, acute  Fever of 100F or higher rologist can be reached at any hour by calling [phone].   DIET:  We do recommend a small meal at first, but then you may proceed to your regular diet.  Drink plenty of fluids but you should avoid alcoholic beverages for 24 hours.  ACTIVITY:  You should plan to take it easy for the rest of today and you should NOT DRIVE  or use heavy machinery until tomorrow (because of the sedation medicines used during the test).    FOLLOW UP: Our staff will call the number listed on your records the next business day following your procedure to check on you and address any questions or concerns that you may have regarding the information given to you following your procedure. If we do not reach you, we will leave a message.  However, if you are feeling well and you are not experiencing any problems, there is no need to return our call.  We will assume that you have returned to your regular daily activities without incident.  If any biopsies were taken you will be contacted by phone or by letter within the next 1-3 weeks.  Please call us at [phone] if you have not heard about the biopsies in 3 weeks.    SIGNATURES/CONFIDENTIALITY: You and/or your care partner have signed paperwork which will be entered into your electronic medical record.  These signatures attest to the fact that that the information above on your After Visit Summary has been reviewed and is understood.  Full responsibility of the confidentiality of this discharge information lies with you and/or your care-partner.

## 2017-05-30 NOTE — Op Note (Signed)
Rebekah Sanchez Patient Name: Rebekah BellingClaudia Sanchez Procedure Date: 05/30/2017 8:05 AM MRN: [id number] Endoscopist: Rebekah DareMalcolm T Rebekah Sanchez , MD Age: [age] Referring MD:  Date of Birth: [birth year] Gender: Female Account #: [id number] Procedure:                Colonoscopy Indications:              Screening in patient at increased risk: Family                            history of 1st-degree relative with colorectal                            cancer Medicines:                Monitored Anesthesia Care Procedure:                Pre-Anesthesia Assessment:                           - Prior to the procedure, a History and Physical                            was performed, and patient medications and                            allergies were reviewed. The patient's tolerance of                            previous anesthesia was also reviewed. The risks                            and benefits of the procedure and the sedation                            options and risks were discussed with the patient.                            All questions were answered, and informed consent                            was obtained. Prior Anticoagulants: The patient has                            taken no previous anticoagulant or antiplatelet                            agents. ASA Grade Assessment: I - A normal, healthy                            patient. After reviewing the risks and benefits,                            the patient was deemed in satisfactory condition to  undergo the procedure.                           After obtaining informed consent, the colonoscope                            was passed under direct vision. Throughout the                            procedure, the patient's blood pressure, pulse, and                            oxygen saturations were monitored continuously. The                            Colonoscope was introduced through the anus and          advanced to the the cecum, identified by                            appendiceal orifice and ileocecal valve. The                            ileocecal valve, appendiceal orifice, and rectum                            were photographed. The quality of the bowel                            preparation was excellent. The patient tolerated                            the procedure well. The colonoscopy was somewhat                            difficult due to significant looping and a tortuous                            colon. Successful completion of the procedure was                            aided by using manual pressure, withdrawing and                            reinserting the scope, withdrawing the scope and                            replacing with the adult colonoscope, straightening                            and shortening the scope to obtain bowel loop                            reduction and using scope torsion. Scope In: 8:16:12 AM Scope Out: 8:28:38 AM Scope Withdrawal  Time: 0 hours 8 minutes 2 seconds  Total Procedure Duration: 0 hours 12 minutes 26 seconds  Findings:                 The perianal and digital rectal examinations were                            normal.                           The entire examined colon appeared normal on direct                            and retroflexion views. Complications:            No immediate complications. Estimated blood loss:                            None. Estimated Blood Loss:     Estimated blood loss: none. Impression:               - The entire examined colon is normal on direct and                            retroflexion views.                           - No specimens collected. Recommendation:           - Repeat colonoscopy in 5 years for screening                            purposes.                           - Patient has a contact number available for                            emergencies. The signs and symptoms of  potential                            delayed complications were discussed with the                            patient. Return to normal activities tomorrow.                            Written discharge instructions were provided to the                            patient.                           - Resume previous diet.                           - Continue present medications. Rebekah Dare, MD 05/30/2017 8:31:37 AM This report has been signed electronically.

## 2017-06-02 ENCOUNTER — Telehealth: Payer: Self-pay

## 2017-06-02 NOTE — Telephone Encounter (Signed)
  Follow up Call-  Call back number 05/30/2017  Post procedure Call Back phone  # [phone]  Permission to leave phone message Yes  Some recent data might be hidden     Patient questions:  Do you have a fever, pain , or abdominal swelling? No. Pain Score  0 *  Have you tolerated food without any problems? Yes.    Have you been able to return to your normal activities? Yes.    Do you have any questions about your discharge instructions: Diet   No. Medications  No. Follow up visit  No.  Do you have questions or concerns about your Care? No.  Actions: * If pain score is 4 or above: No action needed, pain <4.

## 2017-08-25 ENCOUNTER — Encounter: Payer: Self-pay | Admitting: Oncology

## 2017-08-25 DIAGNOSIS — Z130 Encounter for screening for diseases of the blood and blood-forming organs and certain disorders involving the immune mechanism: Secondary | ICD-10-CM | POA: Diagnosis not present

## 2017-08-25 DIAGNOSIS — Z8041 Family history of malignant neoplasm of ovary: Secondary | ICD-10-CM | POA: Diagnosis not present

## 2017-08-25 DIAGNOSIS — Z131 Encounter for screening for diabetes mellitus: Secondary | ICD-10-CM | POA: Diagnosis not present

## 2017-08-25 DIAGNOSIS — Z01419 Encounter for gynecological examination (general) (routine) without abnormal findings: Secondary | ICD-10-CM | POA: Diagnosis not present

## 2017-08-25 DIAGNOSIS — Z803 Family history of malignant neoplasm of breast: Secondary | ICD-10-CM | POA: Diagnosis not present

## 2017-08-25 DIAGNOSIS — Z6821 Body mass index (BMI) 21.0-21.9, adult: Secondary | ICD-10-CM | POA: Diagnosis not present

## 2017-08-25 DIAGNOSIS — Z0000 Encounter for general adult medical examination without abnormal findings: Secondary | ICD-10-CM | POA: Diagnosis not present

## 2017-08-25 DIAGNOSIS — Z13220 Encounter for screening for lipoid disorders: Secondary | ICD-10-CM | POA: Diagnosis not present

## 2017-12-04 DIAGNOSIS — Z23 Encounter for immunization: Secondary | ICD-10-CM | POA: Diagnosis not present

## 2017-12-12 DIAGNOSIS — D508 Other iron deficiency anemias: Secondary | ICD-10-CM | POA: Diagnosis not present

## 2017-12-26 ENCOUNTER — Encounter: Payer: Self-pay | Admitting: Hematology and Oncology

## 2017-12-26 ENCOUNTER — Telehealth: Payer: Self-pay | Admitting: Hematology and Oncology

## 2017-12-26 NOTE — Telephone Encounter (Signed)
New referral received from Jarrett Soho, PA from [facility] for anemia. Pt has been cld and scheduled to see Dr. Caron Presume on 11/1 at 1pm. Pt agreed to the appt date and time. Letter mailed.

## 2017-12-30 DIAGNOSIS — R0789 Other chest pain: Secondary | ICD-10-CM | POA: Diagnosis not present

## 2017-12-30 DIAGNOSIS — M549 Dorsalgia, unspecified: Secondary | ICD-10-CM | POA: Diagnosis not present

## 2018-01-09 ENCOUNTER — Inpatient Hospital Stay: Payer: 59

## 2018-01-09 ENCOUNTER — Encounter: Payer: Self-pay | Admitting: Hematology and Oncology

## 2018-01-09 ENCOUNTER — Inpatient Hospital Stay: Payer: 59 | Attending: Hematology and Oncology | Admitting: Hematology and Oncology

## 2018-01-09 ENCOUNTER — Other Ambulatory Visit: Payer: Self-pay

## 2018-01-09 ENCOUNTER — Encounter: Payer: Self-pay | Admitting: Physical Therapy

## 2018-01-09 ENCOUNTER — Ambulatory Visit: Payer: 59 | Attending: Family Medicine | Admitting: Physical Therapy

## 2018-01-09 VITALS — BP 108/69 | HR 75 | Temp 98.3°F | Resp 18 | Ht 62.0 in | Wt 121.3 lb

## 2018-01-09 DIAGNOSIS — M546 Pain in thoracic spine: Secondary | ICD-10-CM | POA: Insufficient documentation

## 2018-01-09 DIAGNOSIS — D649 Anemia, unspecified: Secondary | ICD-10-CM

## 2018-01-09 DIAGNOSIS — R293 Abnormal posture: Secondary | ICD-10-CM | POA: Diagnosis not present

## 2018-01-09 DIAGNOSIS — D509 Iron deficiency anemia, unspecified: Secondary | ICD-10-CM | POA: Diagnosis not present

## 2018-01-09 DIAGNOSIS — M6281 Muscle weakness (generalized): Secondary | ICD-10-CM | POA: Diagnosis not present

## 2018-01-09 LAB — CBC WITH DIFFERENTIAL (CANCER CENTER ONLY)
Abs Immature Granulocytes: 0.03 10*3/uL (ref 0.00–0.07)
BASOS PCT: 0 %
Basophils Absolute: 0 10*3/uL (ref 0.0–0.1)
EOS ABS: 0.3 10*3/uL (ref 0.0–0.5)
Eosinophils Relative: 3 %
HEMATOCRIT: 37.9 % (ref 36.0–46.0)
Hemoglobin: 12.4 g/dL (ref 12.0–15.0)
IMMATURE GRANULOCYTES: 0 %
LYMPHS ABS: 2.2 10*3/uL (ref 0.7–4.0)
Lymphocytes Relative: 23 %
MCH: 29.5 pg (ref 26.0–34.0)
MCHC: 32.7 g/dL (ref 30.0–36.0)
MCV: 90.2 fL (ref 80.0–100.0)
MONO ABS: 0.7 10*3/uL (ref 0.1–1.0)
MONOS PCT: 8 %
NEUTROS PCT: 66 %
Neutro Abs: 6.1 10*3/uL (ref 1.7–7.7)
PLATELETS: 376 10*3/uL (ref 150–400)
RBC: 4.2 MIL/uL (ref 3.87–5.11)
RDW: 13.5 % (ref 11.5–15.5)
WBC Count: 9.4 10*3/uL (ref 4.0–10.5)
nRBC: 0 % (ref 0.0–0.2)

## 2018-01-09 LAB — RETIC PANEL
IMMATURE RETIC FRACT: 4 % (ref 2.3–15.9)
RBC.: 4.2 MIL/uL (ref 3.87–5.11)
RETIC CT PCT: 1.3 % (ref 0.4–3.1)
RETICULOCYTE HEMOGLOBIN: 35.6 pg (ref >27.9)
Retic Count, Absolute: 55.4 10*3/uL (ref 19.0–186.0)

## 2018-01-09 NOTE — Patient Instructions (Signed)
Access Code: [email or web address]  URL: [email or web address]  Date: 01/09/2018  Prepared by: Morton Peters   Patient Education  Trigger Point Dry Needling

## 2018-01-09 NOTE — Patient Instructions (Signed)
We discussed in detail the results of your previous laboratory studies with Jarrett Soho.  They suggest iron deficiency anemia.  Laboratory studies will be done today to assess any other potential causes of your anemia.  For the time being continue on ferrous sulfate: 325 mg (65 mg elemental iron) once daily.  Barring any unforeseen complications, your next scheduled doctor visit to discuss the results of today's laboratory studies is on November 18.  Please do not hesitate to call should any new or untoward problems arise.  Thank you! Debbe Odea, MD Hematology/Oncology

## 2018-01-09 NOTE — Therapy (Signed)
[facility] [address] [place], [place], [address] Phone: [phone]   Fax:  [phone]  Physical Therapy Evaluation  Patient Details  Name: Rebekah Sanchez MRN: [id number] Date of Birth: 28-Jun-1975 Referring Provider (PT): Via, Caryn Bee, MD   Encounter Date: 01/09/2018  PT End of Session - 01/09/18 1229    Visit Number  1    Date for PT Re-Evaluation  03/06/18    Authorization Type  [employer]    Authorization - Visit Number  75    PT Start Time  1018    PT Stop Time  1105    PT Time Calculation (min)  47 min    Activity Tolerance  Patient tolerated treatment well;Patient limited by pain   Pt reported increased soreness from palpation during evaluation       Past Medical History:  Diagnosis Date  . Family hx of colon cancer    Mother  . History of Helicobacter pylori infection    WHEN SHE WAS 18    History reviewed. No pertinent surgical history.  There were no vitals filed for this visit.   Subjective Assessment - 01/09/18 1020    Subjective  Pt reports history of intermittent episodic upper back and chest pain linked to poor seating over long car trip approx 7 years ago and again approx 3 years ago after pushing heavy cart at [employer].  Current episode began approx 1 month ago following a 2 hour hike carrying a 5# backpack.  Pt described pain as tightenss, soreness, tenderness on spine with shooting tingling up both side of neck into back of head and shortness of breath/pressure in chest.  Pain is worse with weight lifting, using arms on elliptical, pushing, and stress.      Pertinent History  migraines, taking iron for anemia which limits ability to take ibuprofen    Limitations  Sitting;Lifting;House hold activities    How long can you sit comfortably?  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  Yes    Pain Score  5    Can be a 0 with exercise, gets up to a 9/10 when migraine is triggered by pain   Pain Location  Chest    Pain Orientation  Mid;Upper    Pain Descriptors / Indicators  Pressure    Pain Type  Chronic pain    Pain Onset  1 to 4 weeks ago    Pain Frequency  Intermittent    Aggravating Factors   lifting weights, laying on back, prolonged driving, stress    Pain Relieving Factors  exercise, estim machine at home, topical cream, massage    Effect of Pain on Daily Activities  fatigue and increased pain as day progresses    Multiple Pain Sites  Yes    Pain Score  5    Pain Location  Back    Pain Orientation  Upper;Mid    Pain Descriptors / Indicators  Tender;Sore;Aching    Pain Type  Chronic pain    Pain Radiating Towards  up the back of neck bil into head as tingling     Pain Onset  1 to 4 weeks ago    Pain Frequency  Intermittent    Aggravating Factors   lifting weights, stress, prolonged driving, laying on back    Pain Relieving Factors  TENS, exercise    Effect of Pain on Daily Activities  fatigue and poor endurance end of day         [facility] PT Assessment - 01/09/18 0001      Assessment   Medical Diagnosis  R07.89 (ICD-10-CM) - Other chest pain   M54.9,G89.29 (ICD-10-CM) - Upper back pain, chronic   Referring Provider (PT)  Via, Caryn Bee, MD    Onset Date/Surgical Date  12/14/17    Hand Dominance  Right    Next MD Visit  no    Prior Therapy  chiropractic 2 years ago for migraines      Precautions   Precautions  None      Restrictions   Weight Bearing Restrictions  No      Balance Screen   Has the patient fallen in the past 6 months  No    Has the patient had a decrease in activity level because of a fear of falling?   No    Is the patient reluctant to  leave their home because of a fear of falling?   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   Vocation Requirements  driving    Leisure  work out at home on [employer], watch movies      Cognition   Overall Cognitive Status  Within Functional Limits for tasks assessed      Observation/Other Assessments   Focus on Therapeutic Outcomes (FOTO)   next visit      Sensation   Additional Comments  Pt with hyper-reactivity to light touch over C7-T3 spinous processes, bil intercostals T3-T7 along lateral and anterior aspect      Posture/Postural Control   Posture/Postural Control  Postural limitations    Postural Limitations  Forward head;Decreased lumbar lordosis;Decreased thoracic kyphosis    Posture Comments  extension hinge present at C7/T1      ROM / Strength   AROM / PROM / Strength  AROM;Strength      AROM   Overall AROM   Within functional limits for tasks performed    Overall AROM Comments  WFL bil UEs     AROM Assessment Site  Cervical    Cervical Flexion  50    Cervical Extension  30    Cervical - Right Side Bend  15    Cervical - Left Side Bend  15    Cervical - Right Rotation  75    Cervical - Left Rotation  75      Strength   Overall Strength  Deficits    Strength Assessment Site  Cervical;Shoulder;Elbow    Right/Left Shoulder  Right;Left    Right Shoulder Flexion  4-/5    Right Shoulder Extension  4-/5    Right Shoulder ABduction  4-/5    Right Shoulder Internal Rotation  4+/5    Right Shoulder External Rotation  4-/5    Right Shoulder Horizontal ABduction  4-/5    Left Shoulder Flexion  4-/5    Left Shoulder ABduction  4-/5  Left Shoulder Internal Rotation  4+/5    Left Shoulder External Rotation  4-/5    Left Shoulder Horizontal ABduction  4/5    Right/Left Elbow  Right;Left     Right Elbow Flexion  4/5    Right Elbow Extension  4-/5    Left Elbow Flexion  4/5    Left Elbow Extension  4-/5    Cervical Flexion  4+/5    Cervical Extension  4+/5    Cervical - Right Side Bend  4+/5    Cervical - Left Side Bend  4+/5    Cervical - Right Rotation  4+/5    Cervical - Left Rotation  4+/5      Flexibility   Soft Tissue Assessment /Muscle Length  yes   upper traps bil     Palpation   Palpation comment  Pt with hypersensitivity to palpation along cervical, ribcage and upper thoracic musculature                Objective measurements completed on examination: See above findings.              PT Education - 01/09/18 1219    Education Details  Access Code: [email or web address] - dry needling education    Person(s) Educated  Patient    Methods  Explanation;Handout    Comprehension  Verbalized understanding       PT Short Term Goals - 01/09/18 1241      PT SHORT TERM GOAL #1   Title  Pt will be ind in initial HEP    Time  4    Period  Weeks    Status  New    Target Date  02/06/18      PT SHORT TERM GOAL #2   Title  Pt will report improved tolerance of daily tasks with pain rating not to exceed 5/10 at least 3 days a week.    Time  4    Period  Weeks    Status  New    Target Date  02/06/18        PT Long Term Goals - 01/09/18 1242      PT LONG TERM GOAL #1   Title  Pt will be independent in advanced HEP.    Time  8    Period  Weeks    Status  New    Target Date  03/06/18      PT LONG TERM GOAL #2   Title  Pt will report pain < or = 3/10 at least 3 days a week to demonstrate improved tolerance of daily demands of child rearing and house upkeep.    Time  8    Period  Weeks    Status  New    Target Date  03/06/18      PT LONG TERM GOAL #3   Title  Pt will demonstrate cervical ROM to WNL without pain to improve ease while driving.    Time  8    Period  Weeks    Status  New    Target Date  03/06/18      PT LONG TERM GOAL #4   Title   Pt will receive 5/5 manual muscle test scores for all cervical and bil UEs.    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  Latest episode began approx 1 month ago after a 2-hour hike carrying a 5# backpack.  Her pain is in the upper back and comes straight through to her chest causing pain and pressure and intermittent shortness of breath.  She also describes radiating tingling up the back of her neck into head bilaterally.  PT notes poor posture and generalized weakness throughout cervical, thoracic and bilateral UEs.  She has an extension hinge at C7/T1.  She has limited cervical ROM but it is painfree.  She reported increased pain with light palpation of musculature throughout chest, upper back and neck, and on spinous processes of C7-T3.  She will benefit from skilled PT to improve ROM, strength, muscle extensibility and posture which should correspond with increased activity tolerance, independence and endurance during daily tasks with less pain.    History and Personal Factors relevant to plan of care:  history of migraines    Clinical Presentation  Stable    Clinical Presentation due to:  poor posture, pain and weakness of cervico-thoracic region and bil UEs, trigger points posterior neck, upper back, chest    Clinical Decision Making  Low    Rehab Potential  Excellent    PT Frequency  2x / week    PT Duration  8 weeks    PT Treatment/Interventions  ADLs/Self Care Home Management;Cryotherapy;Electrical Stimulation;Moist Heat;Traction;Functional mobility training;Therapeutic activities;Therapeutic exercise;Neuromuscular re-education;Patient/family education;Manual techniques;Passive range of motion;Dry needling;Spinal Manipulations;Joint Manipulations    PT Next  Visit Plan  did Pt call about pain medication option safe to take with iron supplement? Dry needling to upper traps, begin cervico-thoracic strengthening/stabilization    PT Home Exercise Plan  Access Code: [email or web address]       Patient will benefit from skilled therapeutic intervention in order to improve the following deficits and impairments:  Improper body mechanics, Pain, Decreased mobility, Increased muscle spasms, Postural dysfunction, Decreased activity tolerance, Decreased endurance, Decreased range of motion, Decreased strength, Impaired flexibility  Visit Diagnosis: Pain in thoracic spine - Plan: PT plan of care cert/re-cert  Abnormal posture - Plan: PT plan of care cert/re-cert  Muscle weakness (generalized) - Plan: PT plan of care cert/re-cert     Problem List Patient Active Problem List   Diagnosis Date Noted  . Esophageal reflux 03/21/2015  . Family history of colon cancer 03/21/2015    Rebekah Sanchez, PT 01/09/18 12:50 PM   [facility] [address] [place], [place], [address] Phone: [phone]   Fax:  [phone]  Name: Rebekah Sanchez MRN: [id number] Date of Birth: 04/08/1975

## 2018-01-10 LAB — HAPTOGLOBIN: HAPTOGLOBIN: 82 mg/dL (ref 34–200)

## 2018-01-10 NOTE — Progress Notes (Signed)
Outpatient Hematology/Oncology Initial Consultation  Patient Name:  Rebekah Sanchez  DOB: [birth year]   Date of Service: January 09, 2018  Referring Provider: Milagros Evener, MD  Marda Stalker, PA-C [place], [place] [address]   Consulting Physician: Henreitta Leber, MD Hematology/Oncology  Reason for Referral: In the setting of mild anemia secondary to iron deficiency in the premenopausal setting, on oral iron over an extended period, she presents now for further diagnostic and therapeutic recommendations.  History Present Illness: Rebekah Sanchez is a 42 year old resident of [place], originally from [place], whose past medical history is significant for previous nonbleeding internal hemorrhoids; and Helicobacter pylori infection when she was 42 years old.  She is a gravida 3 para 2 miscarriage 1. She continues to menstruate on a monthly basis every 28-29 days without abnormal uterine bleeding. Very recently, she was camping and developed some pain in the mid back which she associates with muscle strain and consequent costochondritis.  It is improving steadily.  She has been on ferrous sulfate: 325 mg once daily beginning March, 2018.  She is alone at this first visit. Her primary care physician is Dr. Milagros Evener. She is also followed by Marda Stalker, physician assistant.  On December 12, 2017: A complete blood count showed hemoglobin 12.4 hematocrit 37.6 MCV 86.0 MCH 28.5 RDW 13.8 WBC 9.8 with 64% neutrophils 26% lymphocytes 8% monocytes 2% eosinophils; platelets 443,000.  Serum iron 60; iron saturation 18%; ferritin 22.  A comprehensive metabolic panel was normal.  On May 09, 2016: A complete blood count showed hemoglobin 11.5 hematocrit 34.1 MCV 83.9 MCH 28.2 WBC 7.9; platelets 352,000.  Serum iron 55; TIBC 418; iron saturation 13%.  Her folic acid was 12.8; vitamin B12 321.  She has no diabetes mellitus, hypertension, or coronary artery disease.   She has no cardiac dysrhythmia.  She denies seizure disorder or stroke syndrome.  She has no thyroid disease.  There is no peptic ulcer or persistent gastroesophageal reflux disease.  She has no viral hepatitis, inflammatory bowel disease, or symptomatic diverticulosis.  She did have a screening colonoscopy in March 2019 because her mother had colon cancer at the age of 42 years.  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  She reports no rheumatoid or gouty arthritis.  She has no peripheral arterial venous thromboembolic disease. Her energy level is stable and consistent with her baseline. She has occasional "migraine headaches," sometimes 1-2 times monthly, generally precipitated by her menstruation.  Rebekah Sanchez denies any appetite or weight deficit. She has no visual changes or hearing problem.   She denies any unusual headache, dizziness, lightheadedness, syncope, or near syncopal episode.  She has no rash or itching.  There is no cough, sore throat, or orthopnea.  She has no dyspnea either at rest or on exertion.  She denies pain or difficulty in swallowing.  No fever, shaking chills, sweats, or flulike symptoms are evident.  She has no heartburn or indigestion.  There is no nausea, vomiting, diarrhea, or constipation.  She has no melena or bright red blood per rectum.  There is no urinary frequency, urgency, hematuria, or dysuria.  Her pain in the mid back and ribs is improving following a outdoor camping trip with heavy backpack.  She has no bleeding tendency or easy bruisability. She has no numbness or tingling in her fingers or toes.  It is with this background she presents now for further diagnostic and therapeutic recommendations in the setting of mild  anemia as outlined above.  Past Medical History:  Diagnosis Date  . Anemia   . Family hx of colon cancer    Mother  . History of Helicobacter pylori infection    WHEN SHE WAS 18   Surgical  History: None  Gynecologic History: Her menarche was at age 63 years. Her last menstruation was on October 1. She is a gravida 3 para 3 miscarriage 1. Her first pregnancy was at age 39 years. Her last pregnancy was at age 81 years. She has never taken oral contraceptives. Her last screening mammogram was in May 2019 Her last Pap smear was in May 2019.  Family History  Problem Relation Age of Onset  . Colon cancer Mother    Social History   Socioeconomic History  . Marital status: Married    Spouse name: Not on file  . Number of children: Not on file  . Years of education: Not on file  . Highest education level: Not on file  Occupational History  . Not on file  Social Needs  . Financial resource strain: Not on file  . Food insecurity:    Worry: Not on file    Inability: Not on file  . Transportation needs:    Medical: Not on file    Non-medical: Not on file  Tobacco Use  . Smoking status: Never Smoker  . Smokeless tobacco: Never Used  Substance and Sexual Activity  . Alcohol use: No  . Drug use: No  . Sexual activity: Not on file  Lifestyle  . Physical activity:    Days per week: Not on file    Minutes per session: Not on file  . Stress: Not on file  Relationships  . Social connections:    Talks on phone: Not on file    Gets together: Not on file    Attends religious service: Not on file    Active member of club or organization: Not on file    Attends meetings of clubs or organizations: Not on file    Relationship status: Not on file  . Intimate partner violence:    Fear of current or ex partner: Not on file    Emotionally abused: Not on file    Physically abused: Not on file    Forced sexual activity: Not on file  Other Topics Concern  . Not on file  Social History Narrative  . Not on file  Mother: Age 69 years: Colon cancer at age 39 years. Father: Deceased: Age 25 years: Complications of alcohol dependence and liver disease. Brothers (2):  HTN Sisters (1): Hypothyroid disease  Transfusion History: No prior transfusion  Exposure History: She has no known exposure to toxic chemicals, radiation, or pesticides.  Allergies  Allergen Reactions  . Hyoscyamine Sulfate     Headaches  Both apples, peaches, plums, and pears cause itchiness and rhinorrhea. She has nonspecific seasonal allergies  Current Outpatient Medications on File Prior to Visit  Medication Sig  . ferrous sulfate 325 (65 FE) MG tablet Take by mouth daily with breakfast.  . Multiple Vitamin (MULTIVITAMIN) tablet Take 1 tablet by mouth daily.   Current Facility-Administered Medications on File Prior to Visit  Medication  . 0.9 %  sodium chloride infusion    Review of Systems: Constitutional: No fever, sweats, or shaking chills.  No appetite or weight deficit.  Energy level stable. Skin: No rash, scaling, sores, lumps, or jaundice. HEENT: No visual changes or hearing deficit. Pulmonary: No unusual cough, sore throat,  or orthopnea. Breasts: No complaints. Cardiovascular: No coronary artery disease, angina, or myocardial infarction. No cardiac dysrhythmia, essential hypertension, or dyslipidemia. Gastrointestinal: No indigestion, dysphagia, abdominal pain, diarrhea, or constipation.  No change in bowel habits; occasional GERD. Genitourinary: No urinary frequency, urgency, hematuria, or dysuria. Musculoskeletal: No arthralgias or myalgias; costochondritis now improving; no joint swelling, pain, or instability. Hematologic: No bleeding tendency or easy bruisability. Endocrine: No intolerance to hot or cold; no thyroid disease or diabetes mellitus. Vascular: No peripheral arterial or venous thromboembolic disease. Psychological: No anxiety, depression, or mood changes; no mental health illnesses. Neurological: No dizziness, lightheadedness, syncope, or near syncopal episodes; no numbness or tingling in the fingers or toes.  Physical Examination: Vital Signs:  Body surface area is 1.55 meters squared.  Vitals:   01/09/18 1320  BP: 108/69  Pulse: 75  Resp: 18  Temp: 98.3 F (36.8 C)  SpO2: 94%    Filed Weights   01/09/18 1320  Weight: 121 lb 4.8 oz (55 kg)  ECOG PERFORMANCE STATUS: 0 Constitutional:  Rebekah Sanchez is a fully nourished and developed Latino. She looks age appropriate.  She is friendly and cooperative without respiratory compromise at rest. Skin: No rashes, scaling, dryness, jaundice, or itching. HEENT: Head is normocephalic and atraumatic.  Pupils are equal round and reactive to light and accommodation.  Sclerae are anicteric.  Conjunctivae are pink.  No sinus tenderness nor oropharyngeal lesions.  Lips without cracking or peeling; tongue without mass, inflammation, or nodularity.  Mucous membranes are moist. Neck: Supple and symmetric.  No jugular venous distention or thyromegaly.  Trachea is midline. Lymphatics: No cervical or supraclavicular lymphadenopathy.  No epitrochlear, axillary, or inguinal lymphadenopathy is appreciated. Respiratory/chest: Thorax is symmetrical.  Breath sounds are clear to auscultation and percussion.  Normal excursion and respiratory effort. Back: Symmetric without deformity or tenderness. Cardiovascular: Heart rate and rhythm are regular without murmurs. Gastrointestinal: Abdomen is soft, nontender; no organomegaly.  Bowel sounds are normoactive.  No masses are appreciated. Extremities: In the lower extremities, there is no asymmetric swelling, erythema, tenderness, or cord formation.  No clubbing, cyanosis, nor edema. Hematologic: No petechiae, hematomas, or ecchymoses. Psychological:  She is oriented to person, place, and time; normal affect, memory, and cognition. Neurological: There are no gross neurologic deficits.  Laboratory Results: I have reviewed the data as listed: January 09, 2018  Ref Range & Units 1d ago (01/09/18) 16yrago (02/10/13) 744yrgo (02/25/10) 7y61yro (02/24/10)  WBC  Count 4.0 - 10.5 K/uL 9.4  8.9 R 12.9High   12.2High    RBC 3.87 - 5.11 MIL/uL 4.20  4.31 R 3.06Low   3.67Low    Hemoglobin 12.0 - 15.0 g/dL 12.4  12.2  8.1 DELTA CHECK NOTED REPEATED TO VERIFYLow   10.1Low    HCT 36.0 - 46.0 % 37.9  36.5  25.5Low   30.8Low    MCV 80.0 - 100.0 fL 90.2  84.7 R 83.3 R 83.9 R  MCH 26.0 - 34.0 pg 29.5   26.5  27.5   MCHC 30.0 - 36.0 g/dL 32.7  33.5  31.8  32.8   RDW 11.5 - 15.5 % 13.5  13.6 R 15.1  15.0   Platelet Count 150 - 400 K/uL 376  457.0High  R 268  317   nRBC 0.0 - 0.2 % 0.0      Neutrophils Relative % % 66  65.1 R    Neutro Abs 1.7 - 7.7 K/uL 6.1  5.8 R    Lymphocytes Relative %  23  24.9 R    Lymphs Abs 0.7 - 4.0 K/uL 2.2  2.2     Monocytes Relative % 8  6.9 R    Monocytes Absolute 0.1 - 1.0 K/uL 0.7  0.6     Eosinophils Relative % 3  2.5 R    Eosinophils Absolute 0.0 - 0.5 K/uL 0.3  0.2 R    Basophils Relative % 0  0.6 R    Basophils Absolute 0.0 - 0.1 K/uL 0.0  0.1     Immature Granulocytes % 0      Abs Immature Granulocytes 0.00 - 0.07 K/uL 0.03        Ref Range & Units 1d ago  Retic Ct Pct 0.4 - 3.1 % 1.3   RBC. 3.87 - 5.11 MIL/uL 4.20   Retic Count, Absolute 19.0 - 186.0 K/uL 55.4   Immature Retic Fract 2.3 - 15.9 % 4.0   Reticulocyte Hemoglobin >27.9 pg 35.6   Comment:     Given the high negative predictive  value of a RET-He result > 32 pg  iron deficiency is essentially  excluded. If this patient is anemic  other etiologies should be  considered.  Haptoglobin 82  Summary/Assessment: In the setting of mild anemia secondary to iron deficiency in the premenopausal setting, on oral iron over an extended period, she presents now for further diagnostic and therapeutic recommendations.  She is a gravida 3 para 2 miscarriage 1. She continues to menstruate on a monthly basis every 28-29 days without abnormal uterine bleeding. Very recently, she was camping and developed some pain in the mid back which she associates with muscle  strain and consequent costochondritis.  It is improving steadily.  She has been on ferrous sulfate: 325 mg once daily beginning March, 2018.   On December 12, 2017: A complete blood count showed hemoglobin 12.4 hematocrit 37.6 MCV 86.0 MCH 28.5 RDW 13.8 WBC 9.8 with 64% neutrophils 26% lymphocytes 8% monocytes 2% eosinophils; platelets 443,000.  Serum iron 60; iron saturation 18%; ferritin 22.  A comprehensive metabolic panel was normal.  On May 09, 2016: A complete blood count showed hemoglobin 11.5 hematocrit 34.1 MCV 83.9 MCH 28.2 WBC 7.9; platelets 352,000.  Serum iron 55; TIBC 418; iron saturation 13%.  Her folic acid was 96.2; vitamin B12 321.  She has no diabetes mellitus, hypertension, or coronary artery disease.  She has no cardiac dysrhythmia.  She denies seizure disorder or stroke syndrome.  She has no thyroid disease.  There is no peptic ulcer or persistent gastroesophageal reflux disease.  She has no viral hepatitis, inflammatory bowel disease, or symptomatic diverticulosis.  She did have a screening colonoscopy in March 2019 because her mother had colon cancer at the age of 52 years.  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  She reports no rheumatoid or gouty arthritis.  She has no peripheral arterial venous thromboembolic disease. Her energy level is stable and consistent with her baseline. She has occasional "migraine headaches," sometimes 1-2 times monthly, generally precipitated by her menstruation.  Rebekah Sanchez denies any appetite or weight deficit. She has no visual changes or hearing problem.   She denies any unusual headache, dizziness, lightheadedness, syncope, or near syncopal episode.  She has no rash or itching.  There is no cough, sore throat, or orthopnea.  She has no dyspnea either at rest or on exertion.  She denies pain or difficulty in swallowing.  No fever, shaking chills, sweats, or flulike symptoms are evident.  She has no heartburn or  indigestion.  There is no nausea, vomiting, diarrhea, or constipation.  She has no melena or bright red blood per rectum.  There is no urinary frequency, urgency, hematuria, or dysuria.  Her pain in the mid back and ribs is improving following a outdoor camping trip with heavy backpack.  She has no bleeding tendency or easy bruisability. She has no numbness or tingling in her fingers or toes.   Her other comorbid problems include previous nonbleeding internal hemorrhoids; and Helicobacter pylori infection when she was 42 years old.   Recommendation/Plan: I discussed with Rebekah Sanchez her previous laboratory studies which include iron studies consistent with iron deficit without iron depletion.  Because her most recent colonoscopy was this past year without evidence of active bleeding, I would consider her anemia on the basis of monthly menstruation in the premenopausal setting.  She is not a vegetarian.  Although she has been taking ferrous sulfate: 325 mg once daily over an extended period, that amount with diet is slightly incomplete in replacing her iron (blood) loss.  Because she is tolerating iron without difficulty, I would consider increasing her ferrous sulfate if her iron studies suggest iron storage deficit. She is asymptomatic.  The results of her laboratory studies were not available at the time of discharge.  Some of those results are outlined above.  Currently she is not anemic.  Her red blood cell indices and reticulocyte hemoglobin are normal. Studies were also done to exclude a hemoglobinopathy or hemolytic process.  Those results will be discussed in detail the time of her next visit.   The total time spent discussing the previous laboratory studies, rationale and methodology for evaluating anemia of iron deficiency and excluding other potential contributing factors with recommendations was 60 minutes. At least 50% of that time was spent in face-to-face discussion, reviewing outside records,  laboratory studies, counseling, and answering questions.  There was ample time allotted to answer all questions. All questions were answered to her satisfaction.   This note was dictated using voice activated technology/software.  Unfortunately, typographical errors are not uncommon, and transcription is subject to mistakes and regrettably misinterpretation.  If necessary, clarification of the above information can be discussed with me at any time.  Thank you Dr. Radene Ou for allowing my participation in the care of Rebekah Sanchez. I will keep you closely informed as the results of her preliminary laboratory data become available. Please do not hesitate to call should any questions arise regarding this initial consultation and discussion.  FOLLOW UP: AS DIRECTED   cc:         Milagros Evener, MD                Marda Stalker, PA-C   Henreitta Leber, MD  Hematology/Oncology [place] [address] [place], [place] [address] Office: [phone] BTDH: [phone]

## 2018-01-12 LAB — HEMOGLOBINOPATHY EVALUATION
HGB A: 97.6 % (ref 96.4–98.8)
HGB F QUANT: 0 % (ref 0.0–2.0)
Hgb A2 Quant: 2.4 % (ref 1.8–3.2)
Hgb C: 0 %
Hgb S Quant: 0 %
Hgb Variant: 0 %

## 2018-01-12 LAB — IRON AND TIBC
Iron: 56 ug/dL (ref 41–142)
Saturation Ratios: 16 % — ABNORMAL LOW (ref 21–57)
TIBC: 348 ug/dL (ref 236–444)
UIBC: 292 ug/dL (ref 120–384)

## 2018-01-12 LAB — FERRITIN: Ferritin: 13 ng/mL (ref 11–307)

## 2018-01-15 ENCOUNTER — Ambulatory Visit: Payer: 59 | Admitting: Physical Therapy

## 2018-01-15 ENCOUNTER — Encounter: Payer: Self-pay | Admitting: Physical Therapy

## 2018-01-15 DIAGNOSIS — M546 Pain in thoracic spine: Secondary | ICD-10-CM

## 2018-01-15 DIAGNOSIS — R293 Abnormal posture: Secondary | ICD-10-CM

## 2018-01-15 DIAGNOSIS — M6281 Muscle weakness (generalized): Secondary | ICD-10-CM

## 2018-01-15 NOTE — Therapy (Signed)
[facility] [address] [place], [place], [address] Phone: [phone]   Fax:  [phone]  Physical Therapy Treatment  Patient Details  Name: Rebekah Sanchez MRN: [id number] Date of Birth: 06/27/1975 Referring Provider (PT): Via, Caryn Bee, MD   Encounter Date: 01/15/2018  PT End of Session - 01/15/18 1150    Visit Number  2    Date for PT Re-Evaluation  03/06/18    Authorization Type  [employer]    Authorization - Visit Number  2    Authorization - Number of Visits  75    PT Start Time  1145    PT Stop Time  1240    PT Time Calculation (min)  55 min    Activity Tolerance  Patient tolerated treatment well;Patient limited by pain    Behavior During Therapy  [facility] for tasks assessed/performed       Past Medical History:  Diagnosis Date  . Anemia   . Family hx of colon cancer    Mother  . History of Helicobacter pylori infection    WHEN SHE WAS 18    History reviewed. No pertinent surgical history.  There were no vitals filed for this visit.  Subjective Assessment - 01/15/18 1148    Subjective  I want to try the dry needling. I felt sore after the exrecises.     Pertinent History  migraines, taking iron for anemia which limits ability to take ibuprofen    Limitations  Sitting;Lifting;House hold activities    How long can you sit comfortably?  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  Yes    Pain Score  1     Pain Location  Back   chest   Pain Orientation  Mid;Upper    Pain Descriptors / Indicators  Aching    Pain Type  Chronic pain    Pain Onset  1 to 4 weeks ago    Pain Frequency  Intermittent    Aggravating Factors   lifting weights, pushing stuff, layin on back, prolonged driving, stress    Pain Relieving Factors  exercise, estim machine at home, topical cream, massage                       [facility] Adult PT Treatment/Exercise - 01/15/18 0001      Modalities   Modalities  Electrical Stimulation;Cryotherapy      Cryotherapy   Number Minutes Cryotherapy  15 Minutes    Cryotherapy Location  Cervical    Type of Cryotherapy  Ice pack      Electrical Stimulation   Electrical Stimulation Location  cervical thoracic    Electrical Stimulation Action  IFC    Electrical Stimulation Parameters  15 min, to patient tolerance    Electrical Stimulation Goals  Pain      Manual Therapy   Manual Therapy  Joint mobilization;Soft tissue mobilization;Myofascial release    Joint Mobilization  PA mobilization to T1-T6 grade III    Soft tissue mobilization  upper thoracic, upper trap, interscapular    Myofascial Release  tissue rolling to the thoracic  PT Education - 01/15/18 1220    Education Details  Access Code: [email or web address]     Person(s) Educated  Patient    Methods  Explanation;Demonstration;Verbal cues;Handout    Comprehension  Returned demonstration;Verbalized understanding       PT Short Term Goals - 01/09/18 1241      PT SHORT TERM GOAL #1   Title  Pt will be ind in initial HEP    Time  4    Period  Weeks    Status  New    Target Date  02/06/18      PT SHORT TERM GOAL #2   Title  Pt will report improved tolerance of daily tasks with pain rating not to exceed 5/10 at least 3 days a week.    Time  4    Period  Weeks    Status  New    Target Date  02/06/18        PT Long Term Goals - 01/09/18 1242      PT LONG TERM GOAL #1   Title  Pt will be independent in advanced HEP.    Time  8    Period  Weeks    Status  New    Target Date  03/06/18      PT LONG TERM GOAL #2   Title  Pt will report pain < or = 3/10 at least 3 days a week to  demonstrate improved tolerance of daily demands of child rearing and house upkeep.    Time  8    Period  Weeks    Status  New    Target Date  03/06/18      PT LONG TERM GOAL #3   Title  Pt will demonstrate cervical ROM to WNL without pain to improve ease while driving.    Time  8    Period  Weeks    Status  New    Target Date  03/06/18      PT LONG TERM GOAL #4   Title  Pt will receive 5/5 manual muscle test scores for all cervical and bil UEs.    Time  8    Period  Weeks    Status  New    Target Date  03/06/18            Plan - 01/15/18 1151    Clinical Impression Statement  Patient was able to demonstrate correct posture after therapy but still has increased cervical thoracic kyphosis. Patient only tolerated dry needling to the upper traps. Patient had increased fascial tissue tightness in the upper and mid thoracic area and decreased movement of the thoracic vertbrae. Patient will benefit skilled PT to improve ROM, strength, muscle extensibility and posture which should correspond with increased activity tolerance, indedendence and edurance during daily tasks with less pain.     Rehab Potential  Excellent    PT Frequency  2x / week    PT Duration  8 weeks    PT Treatment/Interventions  ADLs/Self Care Home Management;Cryotherapy;Electrical Stimulation;Moist Heat;Traction;Functional mobility training;Therapeutic activities;Therapeutic exercise;Neuromuscular re-education;Patient/family education;Manual techniques;Passive range of motion;Dry needling;Spinal Manipulations;Joint Manipulations    PT Next Visit Plan  needs FOTO, did Pt call about pain medication option safe to take with iron supplement? Try dry needling to upper traps (Pt nervous), begin cervico-thoracic strengthening/stabilization; Assess dry needling    PT Home Exercise Plan  Access Code: [email or web address]    Consulted and Agree with Plan of Care  Patient  Patient will benefit from skilled therapeutic intervention in  order to improve the following deficits and impairments:  Improper body mechanics, Pain, Decreased mobility, Increased muscle spasms, Postural dysfunction, Decreased activity tolerance, Decreased endurance, Decreased range of motion, Decreased strength, Impaired flexibility  Visit Diagnosis: Pain in thoracic spine  Abnormal posture  Muscle weakness (generalized)     Problem List Patient Active Problem List   Diagnosis Date Noted  . Esophageal reflux 03/21/2015  . Family history of colon cancer 03/21/2015    Eulis Foster, PT 01/15/18 12:30 PM   [facility] [address], [address] [place], [place], [address] Phone: [phone]   Fax:  [phone]  Name: Rebekah Sanchez MRN: [id number] Date of Birth: Feb 01, 1976

## 2018-01-15 NOTE — Patient Instructions (Signed)
Access Code: [email or web address]  URL: [email or web address]  Date: 01/15/2018  Prepared by: Eulis Foster   Exercises  Seated Cervical Sidebending Stretch - 2 reps - 1 sets - 15 hold - 1x daily - 7x weekly  Doorway Pec Stretch at 120 Degrees Abduction - 2 reps - 1 sets - 30 sec hold - 1x daily - 7x weekly  Seated Upper Thoracic Stretch - 2 reps - 1 sets - 15 sec hold - 1x daily - 7x weekly  Correct Seated Posture - 1 reps - 1 sets - 15 sec hold - 1x daily - 7x weekly  Patient Education  [facility] [address], [address] [place], [place] [address] Phone # [phone] Fax [phone]

## 2018-01-22 ENCOUNTER — Ambulatory Visit: Payer: 59 | Admitting: Physical Therapy

## 2018-01-22 ENCOUNTER — Encounter: Payer: Self-pay | Admitting: Physical Therapy

## 2018-01-22 DIAGNOSIS — M546 Pain in thoracic spine: Secondary | ICD-10-CM

## 2018-01-22 DIAGNOSIS — R293 Abnormal posture: Secondary | ICD-10-CM

## 2018-01-22 DIAGNOSIS — M6281 Muscle weakness (generalized): Secondary | ICD-10-CM

## 2018-01-22 NOTE — Therapy (Signed)
[facility] [address], [address] [place], [place], [address] Phone: [phone]   Fax:  [phone]  Physical Therapy Treatment  Patient Details  Name: Rebekah Sanchez MRN: [id number] Date of Birth: February 08, 1976 Referring Provider (PT): Via, Caryn BeeKevin, MD   Encounter Date: 01/22/2018  PT End of Session - 01/22/18 1145    Visit Number  3    Date for PT Re-Evaluation  03/06/18    Authorization Type  [employer]    Authorization - Visit Number  3    Authorization - Number of Visits  75    PT Start Time  1145    PT Stop Time  1228    PT Time Calculation (min)  43 min    Activity Tolerance  Patient tolerated treatment well;Patient limited by pain    Behavior During Therapy  [facility] for tasks assessed/performed       Past Medical History:  Diagnosis Date  . Anemia   . Family hx of colon cancer    Mother  . History of Helicobacter pylori infection    WHEN SHE WAS 18    History reviewed. No pertinent surgical history.  There were no vitals filed for this visit.  Subjective Assessment - 01/22/18 1325    Subjective  Pt reported the pain/soreness outweighed the benefit of dry needling and she'd like to hold off on that for now.      Pertinent History  migraines, taking iron for anemia which limits ability to take ibuprofen    Limitations  Sitting;Lifting;House hold activities    How long can you sit comfortably?  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  Yes   threat of headache today   Pain Score  2     Pain Location  Back    Pain Orientation  Upper    Pain Descriptors / Indicators  Aching    Pain Type  Chronic pain                        [facility] Adult PT Treatment/Exercise - 01/22/18 0001      Therapeutic Activites    Therapeutic Activities  --   sitting posture in car, use of lumbar roll, pelvic alignment     Exercises   Exercises  Neck;Shoulder      Neck Exercises: Supine   Neck Retraction Limitations  cued neck retractions throughout ther ex reps for scapular stabilization      Shoulder Exercises: Supine   Horizontal ABduction  Strengthening;Both;10 reps;Theraband    Theraband Level (Shoulder Horizontal ABduction)  Level 1 (Yellow)    Horizontal ABduction Limitations  with neck retraction   PT cued scap retraction and depression to avoid hiking   Diagonals  Strengthening;Both;10 reps;Theraband    Theraband Level (Shoulder Diagonals)  Level 1 (Yellow)    Diagonals Limitations  with neck retraction      Shoulder Exercises: Standing   Row  Strengthening;10 reps;Both;Theraband    Theraband Level (Shoulder Row)  Level 1 (Yellow)    Row Limitations  Pt with difficulty recruiting scap retractors despite tactile cueing, PT D/C'd  PT Education - 01/22/18 1225    Education Details  Access Code: [email or web address]    Person(s) Educated  Patient    Methods  Explanation;Demonstration;Verbal cues;Handout    Comprehension  Verbalized understanding;Returned demonstration       PT Short Term Goals - 01/09/18 1241      PT SHORT TERM GOAL #1   Title  Pt will be ind in initial HEP    Time  4    Period  Weeks    Status  New    Target Date  02/06/18      PT SHORT TERM GOAL #2   Title  Pt will report improved tolerance of daily tasks with pain rating not to exceed 5/10 at least 3 days a week.    Time  4    Period  Weeks    Status  New    Target Date  02/06/18        PT Long Term Goals - 01/09/18 1242      PT LONG TERM GOAL #1   Title  Pt will be independent in advanced HEP.    Time  8    Period  Weeks    Status  New    Target Date  03/06/18      PT LONG TERM GOAL  #2   Title  Pt will report pain < or = 3/10 at least 3 days a week to demonstrate improved tolerance of daily demands of child rearing and house upkeep.    Time  8    Period  Weeks    Status  New    Target Date  03/06/18      PT LONG TERM GOAL #3   Title  Pt will demonstrate cervical ROM to WNL without pain to improve ease while driving.    Time  8    Period  Weeks    Status  New    Target Date  03/06/18      PT LONG TERM GOAL #4   Title  Pt will receive 5/5 manual muscle test scores for all cervical and bil UEs.    Time  8    Period  Weeks    Status  New    Target Date  03/06/18            Plan - 01/22/18 1225    Clinical Impression Statement  Pt decided she'd like to hold on dry needling and try other PT.  She was able to perform neck retraction with scapular stabilization in supine but tended to have difficulty with this in standing.  Her awareness of scapular retraction was initially poor but improved with mod-max cueing.  She tends to be highly sensitive in her tissues and quick to fatigue and reports soreness.  She is a willing participant and will likely benefit from paced continuation of ther ex and stabilization to address pain and lack of strength/endurance.  PT evaluated sitting posture in car as Pt drives a lot and reports pain with this.  Added lumbar roll and self-awareness of shoulder hiking while driving.     Rehab Potential  Excellent    PT Frequency  2x / week    PT Duration  8 weeks    PT Treatment/Interventions  ADLs/Self Care Home Management;Cryotherapy;Electrical Stimulation;Moist Heat;Traction;Functional mobility training;Therapeutic activities;Therapeutic exercise;Neuromuscular re-education;Patient/family education;Manual techniques;Passive range of motion;Dry needling;Spinal Manipulations;Joint Manipulations    PT Next Visit Plan  needs FOTO, did Pt call about pain medication option safe to take with  iron supplement? Try dry needling to upper traps (Pt  nervous), begin cervico-thoracic strengthening/stabilization; Assess dry needling    PT Home Exercise Plan  Access Code: [email or web address]    Consulted and Agree with Plan of Care  Patient       Patient will benefit from skilled therapeutic intervention in order to improve the following deficits and impairments:  Improper body mechanics, Pain, Decreased mobility, Increased muscle spasms, Postural dysfunction, Decreased activity tolerance, Decreased endurance, Decreased range of motion, Decreased strength, Impaired flexibility  Visit Diagnosis: Pain in thoracic spine  Abnormal posture  Muscle weakness (generalized)     Problem List Patient Active Problem List   Diagnosis Date Noted  . Esophageal reflux 03/21/2015  . Family history of colon cancer 03/21/2015   Morton Peters, PT 01/22/18 1:27 PM   [facility] [address], [address] [place], [place], [address] Phone: [phone]   Fax:  [phone]  Name: Rebekah Sanchez MRN: [id number] Date of Birth: April 16, 1975

## 2018-01-22 NOTE — Patient Instructions (Signed)
Access Code: [email or web address]  URL: [email or web address]  Date: 01/22/2018  Prepared by: Loistine SimasJohanna Pocahontas Cohenour   Exercises  Seated Cervical Sidebending Stretch - 2 reps - 1 sets - 15 hold - 1x daily - 7x weekly  Doorway Pec Stretch at 120 Degrees Abduction - 2 reps - 1 sets - 30 sec hold - 1x daily - 7x weekly  Seated Upper Thoracic Stretch - 2 reps - 1 sets - 15 sec hold - 1x daily - 7x weekly  Correct Seated Posture - 1 reps - 1 sets - 15 sec hold - 1x daily - 7x weekly  Supine Shoulder Horizontal Abduction with Resistance - 10 reps - 3 sets - 1x daily - 7x weekly  Seated Shoulder Diagonal Pulls with Resistance - 10 reps - 3 sets - 1x daily - 7x weekly

## 2018-01-23 DIAGNOSIS — R293 Abnormal posture: Secondary | ICD-10-CM | POA: Diagnosis not present

## 2018-01-23 DIAGNOSIS — M546 Pain in thoracic spine: Secondary | ICD-10-CM | POA: Diagnosis not present

## 2018-01-23 DIAGNOSIS — M6281 Muscle weakness (generalized): Secondary | ICD-10-CM | POA: Diagnosis not present

## 2018-01-26 ENCOUNTER — Encounter: Payer: Self-pay | Admitting: Hematology and Oncology

## 2018-01-26 ENCOUNTER — Inpatient Hospital Stay (HOSPITAL_BASED_OUTPATIENT_CLINIC_OR_DEPARTMENT_OTHER): Payer: 59 | Admitting: Hematology and Oncology

## 2018-01-26 VITALS — BP 102/55 | HR 71 | Temp 97.6°F | Resp 18 | Ht 62.0 in | Wt 122.9 lb

## 2018-01-26 DIAGNOSIS — D509 Iron deficiency anemia, unspecified: Secondary | ICD-10-CM | POA: Diagnosis not present

## 2018-01-26 DIAGNOSIS — M546 Pain in thoracic spine: Secondary | ICD-10-CM | POA: Diagnosis not present

## 2018-01-26 DIAGNOSIS — D500 Iron deficiency anemia secondary to blood loss (chronic): Secondary | ICD-10-CM

## 2018-01-26 DIAGNOSIS — M6281 Muscle weakness (generalized): Secondary | ICD-10-CM | POA: Diagnosis not present

## 2018-01-26 DIAGNOSIS — R293 Abnormal posture: Secondary | ICD-10-CM | POA: Diagnosis not present

## 2018-01-26 DIAGNOSIS — E611 Iron deficiency: Secondary | ICD-10-CM | POA: Insufficient documentation

## 2018-01-26 MED ORDER — POLYSACCHARIDE IRON COMPLEX 150 MG PO CAPS: 150.0000 mg | ORAL_CAPSULE | Freq: Two times a day (BID) | ORAL | 3 refills | Status: DC

## 2018-01-26 NOTE — Patient Instructions (Signed)
We discussed in detail your laboratory studies from 2 weeks ago.  Although you are not anemic, your iron stores are low.  I recommend Niferex: 150 mg twice daily.  A prescription has been called into [facility].  Start by taking 1 tablet daily with food.  If there are no problems, increase to 1 tablet twice daily.  If it causes any upset stomach, diarrhea, or constipation do not increase it 2 tablets daily.  Call us for direction.  Copies of your lab work were given to you for your review.  Barring any unforeseen complications, a follow-up visit with lab work is scheduled on April 06, 2018 with Dr. Truett PernaSherrill.  Please do not hesitate to call should any new or untoward problems arise.  Thank you! Happy Thanksgiving! Debbe Odeaichard Carmelo Reidel, MD Hematology/Oncology

## 2018-01-26 NOTE — Progress Notes (Signed)
Hematology/Oncology Outpatient Progress Note  Patient Name:  Rebekah Sanchez  DOB:1976/03/01   Date of Service: January 26, 2018  Referring Provider: Beverley Fiedler, MD  Rebekah Soho, PA-C [address] [place], [place] [address]   Consulting Physician: Toni Arthurs, MD Hematology/Oncology  Reason for Visit: In the setting of mild anemia secondary to iron deficiency in the premenopausal setting, on oral iron over an extended period, she presents now for the results of her laboratory studies and recommendations.  Summary/Assessment: Iron deficiency without depletion or anemia. Premenopausal with monthly menstruation No evidence of hemoglobinopathy No hemolytic process Serum ferritin 13 Taking a Shaklee product with little elemental iron (18 mg) She is intolerant to ferrous sulfate (gastric upset).  Recommendation/Plan: We reviewed and discussed her laboratory studies from November 1. Copies of her lab work were given to her for review. January 09, 2018:    CBC: Hgb 12.4 hematocrit 37.9% MCV 90.2 MCH 29.5 WBC 9.4; PLT 376,000    Iron/TIBC 56/348    Iron saturation 16%    Ferritin 13    Hemoglobin electrophoresis: Normal adult hemoglobin      Haptoglobin 82 Intolerant to ferrous sulfate, Niferex: 150 mg twice daily was prescribed. She was instructed on how to take it starting with 1 tablet once daily and titrating. Barring any unforeseen complications, she is scheduled to see Dr. Truett Perna on January 27. She was advised to call us in the interim should any new or untoward problems arise.  Brief History: Rebekah Sanchez is a 42 year old resident of [place], originally from [place] whose past medical history is significant for previous nonbleeding internal hemorrhoids; and Helicobacter pylori infection when she was 42 years old.  She is a gravida 3 para 2 miscarriage 1. She continues to menstruate on a monthly basis every 28-29 days without abnormal  uterine bleeding. Very recently, she was camping and developed some pain in the mid back which she associates with muscle strain and consequent costochondritis.  It is improving steadily.  She had been on ferrous sulfate: 325 mg once daily beginning March, 2018.  She stopped taking it due to upset stomach.  Several months earlier she started a Shaklee product with iron and other minerals included. Her primary care physician is Dr. Beverley Fiedler. She is also followed by Rebekah Sanchez, physician assistant.  On December 12, 2017: A complete blood count showed hemoglobin 12.4 hematocrit 37.6 MCV 86.0 MCH 28.5 RDW 13.8 WBC 9.8 with 64% neutrophils 26% lymphocytes 8% monocytes 2% eosinophils; platelets 443,000.  Serum iron 60; iron saturation 18%; ferritin 22.  A comprehensive metabolic panel was normal.  On May 09, 2016: A complete blood count showed hemoglobin 11.5 hematocrit 34.1 MCV 83.9 MCH 28.2 WBC 7.9; platelets 352,000.  Serum iron 55; TIBC 418; iron saturation 13%.  Her folic acid was 11.3; vitamin B12 321.  She did have a screening colonoscopy in March 2019 because her mother had colon cancer at the age of 29 years. 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 Her energy level is stable and consistent with her baseline. She has occasional "migraine headaches," sometimes 1-2 times monthly, generally precipitated by her menstruation. She is not on contraception. She menstruates on a monthly basis generally 3 days with moderate flow.  She has no gastroesophageal reflux or peptic ulcer disease.  It is with this background she presents now for the results of her preliminary laboratory evaluation and recommendations in the setting of low iron stores without symptomatic anemia.  Interval History: In the interim since her last visit, she denies any new problems or complaints. Rebekah Sanchez has no appetite or weightDebarah Sanchez deficit. She has no visual changes or hearing problem. She denies any  unusual headache, dizziness, lightheadedness, syncope, or near syncopal episode. She has no rash or itching.  There is no cough, sore throat, or orthopnea.  She has no dyspnea either at rest or on exertion.  She denies pain or difficulty in swallowing.  No fever, shaking chills, sweats, or flulike symptoms are evident.  She has no heartburn or indigestion.  There is no nausea, vomiting, diarrhea, or constipation.  She has no melena or bright red blood per rectum.  There is no urinary frequency, urgency, hematuria, or dysuria.  Her pain in the mid back and ribs is improving following a outdoor camping trip with heavy backpack.  She has no bleeding tendency or easy bruisability. She has no numbness or tingling in her fingers or toes.   Past Medical History Reviewed        Family History Reviewed        Social History Reviewed  Past Medical History:  Diagnosis Date  . Anemia   . Family hx of colon cancer    Mother  . History of Helicobacter pylori infection    WHEN SHE WAS 18   Review of Systems: Constitutional: No fever, sweats, or shaking chills.  No appetite or weight deficit.  Energy level stable. Skin: No rash, scaling, sores, lumps, or jaundice. HEENT: No visual changes or hearing deficit. Pulmonary: No unusual cough, sore throat, or orthopnea. Breasts: No complaints. Cardiovascular: No coronary artery disease, angina, or myocardial infarction. No cardiac dysrhythmia, essential hypertension, or dyslipidemia. Gastrointestinal: No indigestion, dysphagia, abdominal pain, diarrhea, or constipation.  No change in bowel habits; occasional GERD. Genitourinary: No urinary frequency, urgency, hematuria, or dysuria. Musculoskeletal: No arthralgias or myalgias; costochondritis now improving; no joint swelling, pain, or instability. Hematologic: No bleeding tendency or easy bruisability. Endocrine: No intolerance to hot or cold; no thyroid disease or diabetes mellitus. Vascular: No peripheral  arterial or venous thromboembolic disease. Psychological: No anxiety, depression, or mood changes; no mental health illnesses. Neurological: No dizziness, lightheadedness, syncope, or near syncopal episodes; no numbness or tingling in the fingers or toes.  Physical Examination: Vital Signs: Body surface area is 1.56 meters squared. Body mass index is 22.48 kg/m. Marland Kitchen.VITALS Vitals:   01/26/18 1619  BP: (!) 102/55  Pulse: 71  Resp: 18  Temp: 97.6 F (36.4 C)  SpO2: 100%    Filed Weights   01/26/18 1619  Weight: 122 lb 14.4 oz (55.7 kg)  ECOG PERFORMANCE STATUS: 0 Constitutional:  Rebekah BellingClaudia Sanchez is a fully nourished and developed Latino. She looks age appropriate.  She is friendly and cooperative without respiratory compromise at rest. Skin: No rashes, scaling, dryness, jaundice, or itching. HEENT: Head is normocephalic and atraumatic.  Pupils are equal round and reactive to light and accommodation.  Sclerae are anicteric.  Conjunctivae are pink.  No sinus tenderness nor oropharyngeal lesions.  Lips without cracking or peeling; tongue without mass, inflammation, or nodularity.  Mucous membranes are moist. Neck: Supple and symmetric.  No jugular venous distention or thyromegaly.  Trachea is midline. Lymphatics: No cervical or supraclavicular lymphadenopathy.  No epitrochlear, axillary, or inguinal lymphadenopathy is appreciated. Respiratory/chest: Thorax is symmetrical.  Breath sounds are clear to auscultation and percussion.  Normal excursion and respiratory effort. Back: Symmetric without deformity or tenderness. Cardiovascular: Heart rate and rhythm are regular without murmurs.  Gastrointestinal: Abdomen is soft, nontender; no organomegaly.  Bowel sounds are normoactive.  No masses are appreciated. Extremities: In the lower extremities, there is no asymmetric swelling, erythema, tenderness, or cord formation.  No clubbing, cyanosis, nor edema. Hematologic: No petechiae, hematomas, or  ecchymoses. Psychological:  She is oriented to person, place, and time; normal affect, memory, and cognition. Neurological: There are no gross neurologic deficits.  Allergies  Allergen Reactions  . Hyoscyamine Sulfate     Headaches  Both apples, peaches, plums, and pears cause itchiness and rhinorrhea. She has nonspecific seasonal allergies   Current Outpatient Medications on File Prior to Visit  Medication Sig  . Multiple Vitamin (MULTIVITAMIN) tablet Take 1 tablet by mouth daily.   Current Facility-Administered Medications on File Prior to Visit  Medication  . 0.9 %  sodium chloride infusion    Laboratory Studies: January 09, 2018  Ref Range & Units 2wk ago (01/09/18) 74yr ago (02/10/13) 79yr ago (02/25/10) 52yr ago (02/24/10)  WBC Count 4.0 - 10.5 K/uL 9.4  8.9 R 12.9High   12.2High    RBC 3.87 - 5.11 MIL/uL 4.20  4.31 R 3.06Low   3.67Low    Hemoglobin 12.0 - 15.0 g/dL 08.6  57.8  8.1 DELTA CHECK NOTED REPEATED TO VERIFYLow   10.1Low    HCT 36.0 - 46.0 % 37.9  36.5  25.5Low   30.8Low    MCV 80.0 - 100.0 fL 90.2  84.7 R 83.3 R 83.9 R  MCH 26.0 - 34.0 pg 29.5   26.5  27.5   MCHC 30.0 - 36.0 g/dL 46.9  62.9  52.8  41.3   RDW 11.5 - 15.5 % 13.5  13.6 R 15.1  15.0   Platelet Count 150 - 400 K/uL 376  457.0High  R 268  317   nRBC 0.0 - 0.2 % 0.0      Neutrophils Relative % % 66  65.1 R    Neutro Abs 1.7 - 7.7 K/uL 6.1  5.8 R    Lymphocytes Relative % 23  24.9 R    Lymphs Abs 0.7 - 4.0 K/uL 2.2  2.2     Monocytes Relative % 8  6.9 R    Monocytes Absolute 0.1 - 1.0 K/uL 0.7  0.6     Eosinophils Relative % 3  2.5 R    Eosinophils Absolute 0.0 - 0.5 K/uL 0.3  0.2 R    Basophils Relative % 0  0.6 R    Basophils Absolute 0.0 - 0.1 K/uL 0.0  0.1     Immature Granulocytes % 0      Abs Immature Granulocytes 0.00 - 0.07 K/uL 0.03        Ref Range & Units 2wk ago  Hgb A2 Quant 1.8 - 3.2 % 2.4   Hgb F Quant 0.0 - 2.0 % 0.0   Hgb S Quant 0.0 % 0.0   Hgb C 0.0 % 0.0 VC  Hgb A 96.4 -  98.8 % 97.6   Hgb Variant 0.0 % 0.0 VC  Please Note:  Comment VC  Comment: (NOTE)  Normal adult hemoglobin present.       Haptoglobin 82 Iron/TIBC 56/348 Iron saturation 16% Ferritin 13 Reticulocyte count 1.3%  The total time spent discussing the results of her laboratory studies, recommendation with regard to iron supplements, and discussion was 15 minutes.  At least 50% of that time was spent in face to face discussion, counseling, and answering questions. There was ample time allotted to answer all  questions.  This note was dictated using voice activated technology/software.  Unfortunately, typographical errors are not uncommon, and transcription is subject to mistakes and regrettably misinterpretation.  If necessary, clarification of the above information can be discussed with me at any time.  FOLLOW UP: AS DIRECTED   cc:         Beverley Fiedler, MD                Rebekah Soho, PA-C   Toni Arthurs, MD  Hematology/Oncology [facility] [address] [place], [place] [address] Office: [phone] Main: [phone]

## 2018-01-27 ENCOUNTER — Encounter: Payer: 59 | Admitting: Physical Therapy

## 2018-01-29 ENCOUNTER — Encounter: Payer: Self-pay | Admitting: Physical Therapy

## 2018-01-29 ENCOUNTER — Ambulatory Visit: Payer: 59 | Admitting: Physical Therapy

## 2018-01-29 DIAGNOSIS — D509 Iron deficiency anemia, unspecified: Secondary | ICD-10-CM | POA: Diagnosis not present

## 2018-01-29 DIAGNOSIS — R293 Abnormal posture: Secondary | ICD-10-CM

## 2018-01-29 DIAGNOSIS — M6281 Muscle weakness (generalized): Secondary | ICD-10-CM | POA: Diagnosis not present

## 2018-01-29 DIAGNOSIS — M546 Pain in thoracic spine: Secondary | ICD-10-CM

## 2018-01-29 NOTE — Therapy (Signed)
[facility] [address] [place], [place], [address] Phone: [phone]   Fax:  [phone]  Physical Therapy Treatment  Patient Details  Name: Rebekah Sanchez MRN: [id number] Date of Birth: [birth year] Referring Provider (PT): Via, Caryn Bee, MD   Encounter Date: 01/29/2018  PT End of Session - 01/29/18 1225    Visit Number  4    Date for PT Re-Evaluation  03/06/18    Authorization Type  U[employer]    Authorization - Visit Number  4    Authorization - Number of Visits  75    PT Start Time  1145    PT Stop Time  1223    PT Time Calculation (min)  38 min    Activity Tolerance  Patient tolerated treatment well;No increased pain    Behavior During Therapy  WFL for tasks assessed/performed       Past Medical History:  Diagnosis Date  . Anemia   . Family hx of colon cancer    Mother  . History of Helicobacter pylori infection    WHEN SHE WAS 18    History reviewed. No pertinent surgical history.  There were no vitals filed for this visit.  Subjective Assessment - 01/29/18 1153    Subjective  Patient reports she is 55% better. I lost my yellow band. My headaches have decreased by 55%.     Pertinent History  migraines, taking iron for anemia which limits ability to take ibuprofen    Limitations  Sitting;Lifting;House hold activities    How long can you sit comfortably?  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  Yes    Pain Score  5     Pain Location  Neck   bil. shoulders   Pain Orientation  Right;Left    Pain Descriptors / Indicators  Aching    Pain Type  Chronic pain    Pain Onset  1 to 4 weeks ago    Pain Frequency   Intermittent    Aggravating Factors   lifting weights, pushing stuff, laying on on back, prolonged, stress.    Pain Relieving Factors  exercise, electrical stim machine at home, topical cream, massage    Multiple Pain Sites  No         [facility] PT Assessment - 01/29/18 0001      Strength   Right Shoulder Flexion  4+/5    Right Shoulder Extension  4+/5    Right Shoulder ABduction  4+/5    Right Shoulder Internal Rotation  5/5    Right Shoulder External Rotation  4+/5    Right Shoulder Horizontal ABduction  4-/5    Left Shoulder Flexion  4+/5    Left Shoulder ABduction  4+/5    Left Shoulder Internal Rotation  5/5    Left Shoulder External Rotation  4+/5    Left Shoulder Horizontal ABduction  4/5    Right Elbow Flexion  5/5    Right Elbow Extension  5/5    Left Elbow Flexion  5/5    Left Elbow Extension  5/5  [facility] Adult PT Treatment/Exercise - 01/29/18 0001      Therapeutic Activites    Therapeutic Activities  Other Therapeutic Activities    Other Therapeutic Activities  standing posture  and walking posture with keeping shoulders down and back with chin retraction, how to use the elliptical with not stressing her shoulders      Exercises   Exercises  Shoulder      Shoulder Exercises: Supine   Horizontal ABduction  Strengthening;Both;10 reps;Theraband    Theraband Level (Shoulder Horizontal ABduction)  Level 1 (Yellow)    Horizontal ABduction Limitations  with neck retraction   PT cued scap retraction and depression to avoid hiking   Diagonals  Strengthening;Both;10 reps;Theraband    Theraband Level (Shoulder Diagonals)  Level 1 (Yellow)      Shoulder Exercises: Standing   Row  Strengthening;Both;10 reps;Weights   bending forward at hips   Row Weight (lbs)  5    Other Standing Exercises  standing with bicep curls with positioning,     Other Standing Exercises  standing bil. elbow extension with red band 2x10;              PT  Education - 01/29/18 1217    Education Details  Access Code: [email or web address]     Person(s) Educated  Patient    Methods  Explanation;Demonstration;Verbal cues;Handout    Comprehension  Returned demonstration;Verbalized understanding       PT Short Term Goals - 01/29/18 1227      PT SHORT TERM GOAL #1   Title  Pt will be ind in initial HEP    Time  4    Period  Weeks    Status  Achieved      PT SHORT TERM GOAL #2   Title  Pt will report improved tolerance of daily tasks with pain rating not to exceed 5/10 at least 3 days a week.    Time  4    Period  Weeks    Status  Achieved        PT Long Term Goals - 01/09/18 1242      PT LONG TERM GOAL #1   Title  Pt will be independent in advanced HEP.    Time  8    Period  Weeks    Status  New    Target Date  03/06/18      PT LONG TERM GOAL #2   Title  Pt will report pain < or = 3/10 at least 3 days a week to demonstrate improved tolerance of daily demands of child rearing and house upkeep.    Time  8    Period  Weeks    Status  New    Target Date  03/06/18      PT LONG TERM GOAL #3   Title  Pt will demonstrate cervical ROM to WNL without pain to improve ease while driving.    Time  8    Period  Weeks    Status  New    Target Date  03/06/18      PT LONG TERM GOAL #4   Title  Pt will receive 5/5 manual muscle test scores for all cervical and bil UEs.    Time  8    Period  Weeks    Status  New    Target Date  03/06/18            Plan - 01/29/18 1150    Clinical Impression Statement  Patient reports she  is 55% better with pain and headaches. Patient has increased strength of bilateral shoulders and elbows. Patient is learning correct posture in sitting and standing. Patient HEP was updated. Patient will benefit from skilled therapy to improve postural strength and endurance for her daily tasks to reduce her pain.     Rehab Potential  Excellent    PT Frequency  2x / week    PT Duration  8 weeks    PT  Treatment/Interventions  ADLs/Self Care Home Management;Cryotherapy;Electrical Stimulation;Moist Heat;Traction;Functional mobility training;Therapeutic activities;Therapeutic exercise;Neuromuscular re-education;Patient/family education;Manual techniques;Passive range of motion;Dry needling;Spinal Manipulations;Joint Manipulations    PT Next Visit Plan  cervico-thoracic strengthening/stabilization; modalities as needed, no DN    PT Home Exercise Plan  Access Code: [email or web address]    Consulted and Agree with Plan of Care  Patient       Patient will benefit from skilled therapeutic intervention in order to improve the following deficits and impairments:  Improper body mechanics, Pain, Decreased mobility, Increased muscle spasms, Postural dysfunction, Decreased activity tolerance, Decreased endurance, Decreased range of motion, Decreased strength, Impaired flexibility  Visit Diagnosis: Pain in thoracic spine  Abnormal posture  Muscle weakness (generalized)     Problem List Patient Active Problem List   Diagnosis Date Noted  . Dietary iron deficiency without anemia 01/26/2018  . Esophageal reflux 03/21/2015  . Family history of colon cancer 03/21/2015    Eulis Foster, PT 01/29/18 12:28 PM   [facility] [address] [place], [place], [address] Phone: [phone]   Fax:  [phone]  Name: LOUVINA CLEARY MRN: [id number] Date of Birth: 03-Mar-1976

## 2018-01-29 NOTE — Patient Instructions (Signed)
Access Code: [email or web address]  URL: [email or web address]  Date: 01/29/2018  Prepared by: Eulis Fosterheryl    Exercises  Seated Cervical Sidebending Stretch - 2 reps - 1 sets - 15 hold - 1x daily - 7x weekly  Doorway Pec Stretch at 120 Degrees Abduction - 2 reps - 1 sets - 30 sec hold - 1x daily - 7x weekly  Seated Upper Thoracic Stretch - 2 reps - 1 sets - 15 sec hold - 1x daily - 7x weekly  Correct Seated Posture - 1 reps - 1 sets - 15 sec hold - 1x daily - 7x weekly  Supine Shoulder Horizontal Abduction with Resistance - 10 reps - 3 sets - 1x daily - 7x weekly  Seated Shoulder Diagonal Pulls with Resistance - 10 reps - 3 sets - 1x daily - 7x weekly  Standing Bent Over Bilateral Shoulder Row with Dumbbells - 10 reps - 3 sets - 1x daily - 7x weekly  Standing Tricep Extensions with Resistance - 10 reps - 2 sets - 1x daily - 7x weekly  Standing Upright Shoulder Row with Barbell - 10 reps - 1 sets - 1x daily - 7x weekly  [facility] [address], [address] [place], [place] [address] Phone # [phone] Fax [phone]

## 2018-02-10 ENCOUNTER — Ambulatory Visit: Payer: 59 | Attending: Family Medicine

## 2018-02-10 DIAGNOSIS — M6281 Muscle weakness (generalized): Secondary | ICD-10-CM

## 2018-02-10 DIAGNOSIS — R293 Abnormal posture: Secondary | ICD-10-CM | POA: Diagnosis not present

## 2018-02-10 DIAGNOSIS — M546 Pain in thoracic spine: Secondary | ICD-10-CM

## 2018-02-10 NOTE — Therapy (Addendum)
[facility] [address] [place], [place], [address] Phone: [phone]   Fax:  [phone]  Physical Therapy Treatment  Patient Details  Name: RUTH KOVICH MRN: [id number] Date of Birth: 1975/04/26 Referring Provider (PT): Via, Caryn Bee, MD   Encounter Date: 02/10/2018  PT End of Session - 02/10/18 1228    Visit Number  5    Date for PT Re-Evaluation  03/06/18    Authorization Type  UHC    PT Start Time  1150    PT Stop Time  1229    PT Time Calculation (min)  39 min    Activity Tolerance  Patient tolerated treatment well;No increased pain    Behavior During Therapy  WFL for tasks assessed/performed       Past Medical History:  Diagnosis Date  . Anemia   . Family hx of colon cancer    Mother  . History of Helicobacter pylori infection    WHEN SHE WAS 18    History reviewed. No pertinent surgical history.  There were no vitals filed for this visit.  Subjective Assessment - 02/10/18 1152    Subjective  I have not been doing my exercises due to being busy.  My worst pain is at night- headache.    Currently in Pain?  Yes    Pain Score  0-No pain    Pain Location  Neck    Pain Orientation  Left;Right    Pain Descriptors / Indicators  Aching    Pain Onset  1 to 4 weeks ago    Pain Frequency  Intermittent                       [facility] Adult PT Treatment/Exercise - 02/10/18 0001      Exercises   Exercises  Shoulder      Neck Exercises: Supine   Neck Retraction Limitations  Melt Method with foam roll      Shoulder Exercises: Supine   Horizontal ABduction  Strengthening;Both;10 reps;Theraband    Theraband Level (Shoulder Horizontal ABduction)  Level 2 (Red)    Horizontal ABduction Limitations  on foam roll    External Rotation  Strengthening;Both;20 reps;Theraband    Theraband Level (Shoulder External Rotation)  Level 2 (Red)    Diagonals  Strengthening;Both;10 reps;Theraband    Theraband Level (Shoulder Diagonals)  Level 2 (Red)    Diagonals Limitations  on foam roll      Shoulder Exercises: Standing   Row  Strengthening;Both;10 reps;Weights   bending forward at hips   Theraband Level (Shoulder Row)  Level 2 (Red)    Other Standing Exercises  --    Other Standing Exercises  --      Shoulder Exercises: ROM/Strengthening   UBE (Upper Arm Bike)  Level 1x 6 minutes (3/3)   PT present to discuss progress              PT Short Term Goals - 01/29/18 1227      PT SHORT TERM GOAL #1   Title  Pt will be ind in initial HEP    Time  4    Period  Weeks    Status  Achieved      PT SHORT TERM GOAL #2   Title  Pt will report improved tolerance of daily tasks with pain rating not to exceed 5/10 at least 3 days a week.    Time  4    Period  Weeks  Status  Achieved        PT Long Term Goals - 02/10/18 1159      PT LONG TERM GOAL #2   Title  Pt will report pain < or = 3/10 at least 3 days a week to demonstrate improved tolerance of daily demands of child rearing and house upkeep.    Baseline  end of the day due to driving    Time  8    Period  Weeks    Status  On-going            Plan - 02/10/18 1205    Clinical Impression Statement  Pt is making steady progress in regards to pain and reports 55% overall reduction in pain.  Pt reports up to 7/10 neck pain only at the end of the day with fatigue and with sleep at night (waking with a headache).  Pt with postural weakness and endurance deficits that are likely contributing to the end of the day pain.  Session focused on review of HEP with emphasis on technique and demo and tactile cues required for scapular position.  Pt tolerated advancement of supine exercises on foam roll today with red band.  Pt demonstrated core instability with this exercise. Pt will continue to benefit from skilled PT for strength and flexibility and manual therapy to reduce pain.      Rehab Potential  Excellent    PT  Frequency  2x / week    PT Duration  8 weeks    PT Treatment/Interventions  ADLs/Self Care Home Management;Cryotherapy;Electrical Stimulation;Moist Heat;Traction;Functional mobility training;Therapeutic activities;Therapeutic exercise;Neuromuscular re-education;Patient/family education;Manual techniques;Passive range of motion;Dry needling;Spinal Manipulations;Joint Manipulations    PT Next Visit Plan  cervico-thoracic strengthening/stabilization; modalities as needed, no DN    PT Home Exercise Plan  Access Code: [email or web address]    Consulted and Agree with Plan of Care  Patient       Patient will benefit from skilled therapeutic intervention in order to improve the following deficits and impairments:  Improper body mechanics, Pain, Decreased mobility, Increased muscle spasms, Postural dysfunction, Decreased activity tolerance, Decreased endurance, Decreased range of motion, Decreased strength, Impaired flexibility  Visit Diagnosis: Pain in thoracic spine  Abnormal posture  Muscle weakness (generalized)     Problem List Patient Active Problem List   Diagnosis Date Noted  . Dietary iron deficiency without anemia 01/26/2018  . Esophageal reflux 03/21/2015  . Family history of colon cancer 03/21/2015   [name] Neelah Mannings, PT 02/10/18 12:30 PM  [facility] [address] [place], [place], [address] Phone: [phone]   Fax:  [phone]  Name: Aldean JewettClaudia P Hepp MRN: [id number] Date of Birth: 1975-08-12

## 2018-02-12 ENCOUNTER — Ambulatory Visit: Payer: 59

## 2018-02-17 ENCOUNTER — Ambulatory Visit: Payer: 59

## 2018-02-17 DIAGNOSIS — M546 Pain in thoracic spine: Secondary | ICD-10-CM | POA: Diagnosis not present

## 2018-02-17 DIAGNOSIS — R293 Abnormal posture: Secondary | ICD-10-CM

## 2018-02-17 DIAGNOSIS — M6281 Muscle weakness (generalized): Secondary | ICD-10-CM

## 2018-02-17 NOTE — Therapy (Signed)
[facility] [address] [place], [place], [address] Phone: [phone]   Fax:  [phone]  Physical Therapy Treatment  Patient Details  Name: Rebekah Sanchez MRN: [id number] Date of Birth: Sep 25, 1975 Referring Provider (PT): Via, Caryn BeeKevin, MD   Encounter Date: 02/17/2018  PT End of Session - 02/17/18 1224    Visit Number  6    Date for PT Re-Evaluation  03/06/18    Authorization Type  UHC    PT Start Time  1145    PT Stop Time  1224    PT Time Calculation (min)  39 min    Activity Tolerance  Patient tolerated treatment well;No increased pain    Behavior During Therapy  WFL for tasks assessed/performed       Past Medical History:  Diagnosis Date  . Anemia   . Family hx of colon cancer    Mother  . History of Helicobacter pylori infection    WHEN SHE WAS 18    History reviewed. No pertinent surgical history.  There were no vitals filed for this visit.  Subjective Assessment - 02/17/18 1152    Subjective  My hips were very sore after last session but not sure why.  I had pain in my upper back at night that day and had headache.      Pertinent History  migraines, taking iron for anemia which limits ability to take ibuprofen    Currently in Pain?  No/denies                       [facility] - 02/17/18 0001      Exercises   Exercises  Shoulder;Lumbar      Lumbar Exercises: Seated   Other Seated Lumbar Exercises  thoracic rotation seated 3x20 seconds      Lumbar Exercises: Sidelying   Other Sidelying Lumbar Exercises  open book stretch x 10 each      Shoulder Exercises: Supine   Horizontal ABduction  Strengthening;Both;10 reps;Theraband    Theraband Level (Shoulder Horizontal ABduction)  Level 1 (Yellow)    External Rotation  Strengthening;Both;20 reps;Theraband    Theraband Level (Shoulder External Rotation)  Level 1 (Yellow)    Diagonals  Strengthening;Both;10  reps;Theraband    Theraband Level (Shoulder Diagonals)  Level 1 (Yellow)      Shoulder Exercises: Seated   Flexion  Strengthening;Both;20 reps;Weights    Flexion Weight (lbs)  1   focus on scapular retraction     Shoulder Exercises: ROM/Strengthening   UBE (Upper Arm Bike)  Level 1x 4 minutes (2/2)               PT Short Term Goals - 01/29/18 1227      PT SHORT TERM GOAL #1   Title  Pt will be ind in initial HEP    Time  4    Period  Weeks    Status  Achieved      PT SHORT TERM GOAL #2   Title  Pt will report improved tolerance of daily tasks with pain rating not to exceed 5/10 at least 3 days a week.    Time  4    Period  Weeks    Status  Achieved        PT Long Term Goals - 02/10/18 1159      PT LONG TERM GOAL #2   Title  Pt will report pain < or = 3/10 at least  3 days a week to demonstrate improved tolerance of daily demands of child rearing and house upkeep.    Baseline  end of the day due to driving    Time  8    Period  Weeks    Status  On-going            Plan - 02/17/18 1211    Clinical Impression Statement  Pt had a flare-up after last session and reported a migraine x 4 days.  Pt does report 60% overall improvement since the start of care and reduced fatigue at the end of the day.   PT reduced theraband to yellow today and had pt do strength in supine without the foam roll (this aggravated hip pain).  Pt tolerated all exercise in the clinic without limitation today.  Pt required verbal and tactile cues to reduce scapular elevation with supine exercise.  Pt with widespread pain and postural weakness and will continue to benefit from skilled PT for strength progression and gentle flexibility as tolerated.      Rehab Potential  Excellent    PT Frequency  2x / week    PT Duration  8 weeks    PT Treatment/Interventions  ADLs/Self Care Home Management;Cryotherapy;Electrical Stimulation;Moist Heat;Traction;Functional mobility training;Therapeutic  activities;Therapeutic exercise;Neuromuscular re-education;Patient/family education;Manual techniques;Passive range of motion;Dry needling;Spinal Manipulations;Joint Manipulations    PT Next Visit Plan  cervico-thoracic strengthening/stabilization; flexibility,  modalities as needed, no DN    PT Home Exercise Plan  Access Code: [email or web address]    Recommended Other Services  initial certification signed    Consulted and Agree with Plan of Care  Patient       Patient will benefit from skilled therapeutic intervention in order to improve the following deficits and impairments:  Improper body mechanics, Pain, Decreased mobility, Increased muscle spasms, Postural dysfunction, Decreased activity tolerance, Decreased endurance, Decreased range of motion, Decreased strength, Impaired flexibility  Visit Diagnosis: Pain in thoracic spine  Abnormal posture  Muscle weakness (generalized)     Problem List Patient Active Problem List   Diagnosis Date Noted  . Dietary iron deficiency without anemia 01/26/2018  . Esophageal reflux 03/21/2015  . Family history of colon cancer 03/21/2015     Lorrene Reid, PT 02/17/18 12:25 PM  [facility] [address] [place], [place], [address] Phone: [phone]   Fax:  [phone]  Name: Rebekah Sanchez MRN: [id number] Date of Birth: Oct 28, 1975

## 2018-02-19 ENCOUNTER — Ambulatory Visit: Payer: 59

## 2018-02-19 DIAGNOSIS — M6281 Muscle weakness (generalized): Secondary | ICD-10-CM

## 2018-02-19 DIAGNOSIS — R293 Abnormal posture: Secondary | ICD-10-CM

## 2018-02-19 DIAGNOSIS — M546 Pain in thoracic spine: Secondary | ICD-10-CM | POA: Diagnosis not present

## 2018-02-19 NOTE — Therapy (Signed)
[facility] [address] [place], [place], [address] Phone: [phone]   Fax:  [phone]  Physical Therapy Treatment  Patient Details  Name: Rebekah Sanchez MRN: [id number] Date of Birth: 1975-03-15 Referring Provider (PT): Via, Caryn Bee, MD   Encounter Date: 02/19/2018  PT End of Session - 02/19/18 1227    Visit Number  7    Date for PT Re-Evaluation  03/06/18    Authorization Type  [employer]    PT Start Time  1145    PT Stop Time  1227    PT Time Calculation (min)  42 min    Activity Tolerance  Patient tolerated treatment well;No increased pain    Behavior During Therapy  WFL for tasks assessed/performed       Past Medical History:  Diagnosis Date  . Anemia   . Family hx of colon cancer    Mother  . History of Helicobacter pylori infection    WHEN SHE WAS 18    History reviewed. No pertinent surgical history.  There were no vitals filed for this visit.  Subjective Assessment - 02/19/18 1153    Subjective  It felt good after last last session.  No increased soreness.      How long can you sit comfortably?  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  No/denies         [facility] PT Assessment - 02/19/18 0001      AROM   Cervical Flexion  55    Cervical - Right Side Bend  40    Cervical - Left Side Bend  40                   [facility] Adult PT Treatment/Exercise - 02/19/18 0001      Lumbar Exercises: Sidelying   Other Sidelying Lumbar Exercises  open book stretch x 10 each      Shoulder Exercises: Supine   Horizontal ABduction  Strengthening;Both;10 reps;Theraband    Theraband Level (Shoulder Horizontal ABduction)  Level 1 (Yellow)    External Rotation  Strengthening;Both;20 reps;Theraband    Theraband Level (Shoulder External Rotation)  Level 1 (Yellow)    Diagonals  Strengthening;Both;10 reps;Theraband    Theraband  Level (Shoulder Diagonals)  Level 1 (Yellow)      Shoulder Exercises: Seated   Flexion  Strengthening;Both;20 reps;Weights    Flexion Weight (lbs)  1   focus on scapular retraction   Abduction  Strengthening;Both;20 reps;Weights    ABduction Weight (lbs)  1    Diagonals  Strengthening;Both;20 reps;Weights    Diagonals Weight (lbs)  1      Shoulder Exercises: Standing   Extension  Strengthening;Both;20 reps;Theraband    Theraband Level (Shoulder Extension)  Level 2 (Red)               PT Short Term Goals - 01/29/18 1227      PT SHORT TERM GOAL #1   Title  Pt will be ind in initial HEP    Time  4    Period  Weeks    Status  Achieved      PT SHORT TERM GOAL #2   Title  Pt will report improved tolerance of daily tasks with pain rating not to exceed 5/10 at least 3 days a week.    Time  4    Period  Weeks    Status  Achieved  PT Long Term Goals - 02/19/18 1208      PT LONG TERM GOAL #3   Title  Pt will demonstrate cervical ROM to WNL without pain to improve ease while driving.    Baseline  flexion 55, sidebending 40 degrees bil    Time  8    Period  Weeks    Status  On-going            Plan - 02/19/18 1205    Clinical Impression Statement  Pt tolerated activity last session without flare-up of symptoms.  PT continued to educate regarding posture, alignment and appropriate level of resistance to avoid strain with exercise.  Pt tolerated advancement of exercise to include scaption and abduction with shoulder raises.  Cervical A/ROM sidebending is improved since evaluation.  Pt with postural weakness and limited endurance throughout the day and will continue to benefit from skilled PT for strength and flexibility as tolerated .     Rehab Potential  Excellent    PT Frequency  2x / week    PT Duration  8 weeks    PT Treatment/Interventions  ADLs/Self Care Home Management;Cryotherapy;Electrical Stimulation;Moist Heat;Traction;Functional mobility  training;Therapeutic activities;Therapeutic exercise;Neuromuscular re-education;Patient/family education;Manual techniques;Passive range of motion;Dry needling;Spinal Manipulations;Joint Manipulations    PT Next Visit Plan  cervico-thoracic strengthening/stabilization; flexibility,  modalities as needed, no DN    PT Home Exercise Plan  Access Code: [email or web address]    Consulted and Agree with Plan of Care  Patient       Patient will benefit from skilled therapeutic intervention in order to improve the following deficits and impairments:  Improper body mechanics, Pain, Decreased mobility, Increased muscle spasms, Postural dysfunction, Decreased activity tolerance, Decreased endurance, Decreased range of motion, Decreased strength, Impaired flexibility  Visit Diagnosis: Pain in thoracic spine  Abnormal posture  Muscle weakness (generalized)     Problem List Patient Active Problem List   Diagnosis Date Noted  . Dietary iron deficiency without anemia 01/26/2018  . Esophageal reflux 03/21/2015  . Family history of colon cancer 03/21/2015    [name] Aditi Rovira, PT 02/19/18 12:30 PM  [facility] [address] [place], [place], [address] Phone: [phone]   Fax:  [phone]  Name: Rebekah Sanchez MRN: [id number] Date of Birth: 03/22/1975

## 2018-02-26 ENCOUNTER — Ambulatory Visit: Payer: 59

## 2018-03-03 ENCOUNTER — Encounter: Payer: 59 | Admitting: Physical Therapy

## 2018-03-10 ENCOUNTER — Ambulatory Visit: Payer: 59

## 2018-03-10 DIAGNOSIS — M546 Pain in thoracic spine: Secondary | ICD-10-CM | POA: Diagnosis not present

## 2018-03-10 DIAGNOSIS — M6281 Muscle weakness (generalized): Secondary | ICD-10-CM

## 2018-03-10 DIAGNOSIS — R293 Abnormal posture: Secondary | ICD-10-CM

## 2018-03-10 NOTE — Therapy (Signed)
[facility] [address] [place], [place], [address] Phone: [phone]   Fax:  [phone]  Physical Therapy Treatment  Patient Details  Name: Rebekah Sanchez MRN: [id number] Date of Birth: 01/25/1976 Referring Provider (PT): Via, Caryn Bee, MD   Encounter Date: 03/10/2018  PT End of Session - 03/10/18 1150    Visit Number  8    Date for PT Re-Evaluation  04/21/18    Authorization Type  UHC    PT Start Time  1103    PT Stop Time  1148    PT Time Calculation (min)  45 min    Activity Tolerance  Patient tolerated treatment well;No increased pain    Behavior During Therapy  WFL for tasks assessed/performed       Past Medical History:  Diagnosis Date  . Anemia   . Family hx of colon cancer    Mother  . History of Helicobacter pylori infection    WHEN SHE WAS 18    History reviewed. No pertinent surgical history.  There were no vitals filed for this visit.  Subjective Assessment - 03/10/18 1104    Subjective  Things are feeling better.  I feel 80% overall improvement.     Pertinent History  migraines, taking iron for anemia which limits ability to take ibuprofen    Currently in Pain?  No/denies         [facility] PT Assessment - 03/10/18 0001      Assessment   Medical Diagnosis  R07.89 (ICD-10-CM) - Other chest pain   M54.9,G89.29 (ICD-10-CM) - Upper back pain, chronic   Referring Provider (PT)  Via, Caryn Bee, MD    Onset Date/Surgical Date  12/14/17      Prior Function   Level of Independence  Independent      Cognition   Overall Cognitive Status  Within Functional Limits for tasks assessed      Observation/Other Assessments   Focus on Therapeutic Outcomes (FOTO)   39% limitation      AROM   Cervical Flexion  70    Cervical Extension  60    Cervical - Right Side Bend  45    Cervical - Left Side Bend  40    Cervical - Right Rotation  80    Cervical - Left Rotation  80      Strength   Overall  Strength  Deficits    Right Shoulder Flexion  4+/5    Right Shoulder ABduction  4+/5    Right Shoulder Internal Rotation  5/5    Right Shoulder External Rotation  4+/5    Left Shoulder Flexion  4+/5    Left Shoulder ABduction  4+/5    Left Shoulder Internal Rotation  5/5    Left Shoulder External Rotation  4+/5                   [facility] Adult PT Treatment/Exercise - 03/10/18 0001      Neck Exercises: Machines for Strengthening   UBE (Upper Arm Bike)  Level 1x 6 minutes   PT present to discuss progress     Neck Exercises: Standing   Other Standing Exercises  snow angels on wall with cervical retraction and scapular retraction x10      Shoulder Exercises: Seated   Flexion  Strengthening;Both;20 reps;Weights    Flexion Weight (lbs)  1   focus on scapular retraction   Abduction  Strengthening;Both;20 reps;Weights    ABduction Weight (lbs)  1    Diagonals  Strengthening;Both;20 reps;Weights    Diagonals Weight (lbs)  1      Shoulder Exercises: Prone   Flexion  Strengthening;Both;20 reps    Extension  Strengthening;Both;20 reps    Horizontal ABduction 1  Strengthening;Right;Left;Both;20 reps             PT Education - 03/10/18 1145    Education Details  Access Code: [email or web address]    Person(s) Educated  Patient    Methods  Explanation;Demonstration;Handout    Comprehension  Verbalized understanding;Returned demonstration       PT Short Term Goals - 01/29/18 1227      PT SHORT TERM GOAL #1   Title  Pt will be ind in initial HEP    Time  4    Period  Weeks    Status  Achieved      PT SHORT TERM GOAL #2   Title  Pt will report improved tolerance of daily tasks with pain rating not to exceed 5/10 at least 3 days a week.    Time  4    Period  Weeks    Status  Achieved        PT Long Term Goals - 03/10/18 1108      PT LONG TERM GOAL #1   Title  Pt will be independent in advanced HEP.    Time  6    Period  Weeks    Status  On-going    Target Date   04/21/18      PT LONG TERM GOAL #2   Title  Pt will report pain < or = 3/10 at least 3 days a week to demonstrate improved tolerance of daily demands of child rearing and house upkeep.    Status  Achieved      PT LONG TERM GOAL #3   Title  Pt will demonstrate cervical ROM to WNL without pain to improve ease while driving.    Baseline  all WNL    Status  Achieved      PT LONG TERM GOAL #4   Title  Pt will receive 5/5 manual muscle test scores for all cervical and bil UEs.    Baseline  4+/5    Time  6    Period  Weeks    Status  On-going    Target Date  04/21/18      PT LONG TERM GOAL #5   Title  reduce FOTO to < 26% limitation    Time  6    Period  Weeks    Status  New    Target Date  04/21/18      Additional Long Term Goals   Additional Long Term Goals  Yes      PT LONG TERM GOAL #6   Title  report no sleep interruptions due to neck/thoracic pain    Time  6    Period  Weeks    Status  New    Target Date  04/21/18            Plan - 03/10/18 1134    Clinical Impression Statement  Pt with lapse in treatment x 2 weeks.  Pt reports that she is 80% improved overall and feels that functional and postural strength remain limited.  Pt has HEP in place and has been compliant.  Cervical A/ROM is WFLs without pain today and UE strength is 4+/5 throughout.  Pt describes no pain most of the time but fatigue with functional  tasks and at the end of the day.  FOTO is 39% limitation.  Pt will continue to benefit from skilled PT for UE and postural strength, flexibility and pain management as needed.      PT Frequency  2x / week    PT Duration  8 weeks    PT Treatment/Interventions  ADLs/Self Care Home Management;Cryotherapy;Electrical Stimulation;Moist Heat;Traction;Functional mobility training;Therapeutic activities;Therapeutic exercise;Neuromuscular re-education;Patient/family education;Manual techniques;Passive range of motion;Dry needling;Spinal Manipulations;Joint Manipulations     PT Next Visit Plan  postural strength and endurance    PT Home Exercise Plan  Access Code: [email or web address]    Consulted and Agree with Plan of Care  Patient       Patient will benefit from skilled therapeutic intervention in order to improve the following deficits and impairments:  Improper body mechanics, Pain, Decreased mobility, Increased muscle spasms, Postural dysfunction, Decreased activity tolerance, Decreased endurance, Decreased range of motion, Decreased strength, Impaired flexibility  Visit Diagnosis: Pain in thoracic spine - Plan: PT plan of care cert/re-cert  Abnormal posture - Plan: PT plan of care cert/re-cert  Muscle weakness (generalized) - Plan: PT plan of care cert/re-cert     Problem List Patient Active Problem List   Diagnosis Date Noted  . Dietary iron deficiency without anemia 01/26/2018  . Esophageal reflux 03/21/2015  . Family history of colon cancer 03/21/2015    [name] Lariah Fleer, PT 03/10/18 11:52 AM  [facility] [address], [address] [place], [place], [address] Phone: [phone]   Fax:  [phone]  Name: Aldean JewettClaudia P Elmes MRN: [id number] Date of Birth: 25-Nov-1975

## 2018-03-10 NOTE — Patient Instructions (Signed)
Access Code: [email or web address]  URL: [email or web address]  Date: 03/10/2018  Prepared by: [name] Takacs   Exercises  Wall Angels - 10 reps - 3 sets - 1x daily - 7x weekly Seated Shoulder Abduction - Palms Down - 10 reps - 2 sets - 2x daily - 7x weekly Seated Shoulder Flexion - 10 reps - 2 sets - 2x daily - 7x weekly Seated Shoulder Scaption - 10 reps - 2 sets - 2x daily - 7x weekly

## 2018-03-17 ENCOUNTER — Ambulatory Visit: Payer: 59 | Attending: Family Medicine

## 2018-03-17 DIAGNOSIS — M546 Pain in thoracic spine: Secondary | ICD-10-CM | POA: Insufficient documentation

## 2018-03-17 DIAGNOSIS — R293 Abnormal posture: Secondary | ICD-10-CM | POA: Diagnosis not present

## 2018-03-17 DIAGNOSIS — M6281 Muscle weakness (generalized): Secondary | ICD-10-CM | POA: Insufficient documentation

## 2018-03-17 NOTE — Therapy (Signed)
[facility] [address], [address] [place], [place], [address] Phone: [phone]   Fax:  [phone]  Physical Therapy Treatment  Patient Details  Name: Rebekah JewettClaudia P Sanchez MRN: [id number] Date of Birth: [birth year] Referring Provider (PT): Via, Caryn BeeKevin, MD   Encounter Date: 03/17/2018  PT End of Session - 03/17/18 1228    Visit Number  9    Date for PT Re-Evaluation  04/21/18    PT Start Time  1147    PT Stop Time  1228    PT Time Calculation (min)  41 min    Activity Tolerance  Patient tolerated treatment well;No increased pain    Behavior During Therapy  WFL for tasks assessed/performed       Past Medical History:  Diagnosis Date  . Anemia   . Family hx of colon cancer    Mother  . History of Helicobacter pylori infection    WHEN SHE WAS 18    History reviewed. No pertinent surgical history.  There were no vitals filed for this visit.  Subjective Assessment - 03/17/18 1152    Subjective  I had a road trip for 4 hours and something tensed in my back.  I have been using heat and a topical rub.      Pertinent History  migraines, taking iron for anemia which limits ability to take ibuprofen    Currently in Pain?  Yes    Pain Score  7    it feels tense and sore   Pain Location  Neck    Pain Orientation  Right    Pain Descriptors / Indicators  Aching    Pain Type  Chronic pain    Pain Onset  More than a month ago    Pain Frequency  Intermittent    Aggravating Factors   after a long car ride, stress    Pain Relieving Factors  exercise, topical cream, massage                       [facility] Adult PT Treatment/Exercise - 03/17/18 0001      Neck Exercises: Machines for Strengthening   UBE (Upper Arm Bike)  Level 1x 6 minutes   PT present to discuss progress- Rt upper trap discomfort     Lumbar Exercises: Sidelying   Other Sidelying Lumbar Exercises  open book stretch x 10 each      Shoulder Exercises:  Seated   Flexion  Strengthening;Both;20 reps;Weights    Flexion Weight (lbs)  1   focus on scapular retraction   Abduction  Strengthening;Both;20 reps;Weights    ABduction Weight (lbs)  1    Diagonals  Strengthening;Both;20 reps;Weights    Diagonals Weight (lbs)  1      Manual Therapy   Manual Therapy  Joint mobilization;Soft tissue mobilization;Myofascial release    Joint Mobilization  PA mobilization to T1-T6 grade III    Soft tissue mobilization  soft tissue elongation and trigger point release to Rt>Lt neck and upper trasp               PT Short Term Goals - 01/29/18 1227      PT SHORT TERM GOAL #1   Title  Pt will be ind in initial HEP    Time  4    Period  Weeks    Status  Achieved      PT SHORT TERM GOAL #2   Title  Pt will report improved tolerance of  daily tasks with pain rating not to exceed 5/10 at least 3 days a week.    Time  4    Period  Weeks    Status  Achieved        PT Long Term Goals - 03/10/18 1108      PT LONG TERM GOAL #1   Title  Pt will be independent in advanced HEP.    Time  6    Period  Weeks    Status  On-going    Target Date  04/21/18      PT LONG TERM GOAL #2   Title  Pt will report pain < or = 3/10 at least 3 days a week to demonstrate improved tolerance of daily demands of child rearing and house upkeep.    Status  Achieved      PT LONG TERM GOAL #3   Title  Pt will demonstrate cervical ROM to WNL without pain to improve ease while driving.    Baseline  all WNL    Status  Achieved      PT LONG TERM GOAL #4   Title  Pt will receive 5/5 manual muscle test scores for all cervical and bil UEs.    Baseline  4+/5    Time  6    Period  Weeks    Status  On-going    Target Date  04/21/18      PT LONG TERM GOAL #5   Title  reduce FOTO to < 26% limitation    Time  6    Period  Weeks    Status  New    Target Date  04/21/18      Additional Long Term Goals   Additional Long Term Goals  Yes      PT LONG TERM GOAL #6    Title  report no sleep interruptions due to neck/thoracic pain    Time  6    Period  Weeks    Status  New    Target Date  04/21/18            Plan - 03/17/18 1203    Clinical Impression Statement  Pt with flare-up of Rt neck pain after riding in the car for 4 hours over the weekend.  Pt reports that she is compliant in HEP and has been stretching more gently due to this onset of pain.  Pt with tension and trigger points in Rt upper trap, thoracic paraspinals and cervical paraspinals and demonstrated improved tissue mobility after manual therapy today.  Pt will continue to benefit from skilled PT for postural strength, cervical and thoracic mobility and manual as needed for pain management.      Rehab Potential  Excellent    PT Frequency  2x / week    PT Duration  8 weeks    PT Treatment/Interventions  ADLs/Self Care Home Management;Cryotherapy;Electrical Stimulation;Moist Heat;Traction;Functional mobility training;Therapeutic activities;Therapeutic exercise;Neuromuscular re-education;Patient/family education;Manual techniques;Passive range of motion;Dry needling;Spinal Manipulations;Joint Manipulations    PT Next Visit Plan  postural strength and endurance, manual therapy as needed    PT Home Exercise Plan  Access Code: [email or web address]       Patient will benefit from skilled therapeutic intervention in order to improve the following deficits and impairments:  Improper body mechanics, Pain, Decreased mobility, Increased muscle spasms, Postural dysfunction, Decreased activity tolerance, Decreased endurance, Decreased range of motion, Decreased strength, Impaired flexibility  Visit Diagnosis: Pain in thoracic spine  Abnormal posture  Muscle weakness (generalized)  Problem List Patient Active Problem List   Diagnosis Date Noted  . Dietary iron deficiency without anemia 01/26/2018  . Esophageal reflux 03/21/2015  . Family history of colon cancer 03/21/2015     Rebekah Sanchez,  PT 03/17/18 12:29 PM  [facility] [address], [address] [place], [place], [address] Phone: [phone]   Fax:  [phone]  Name: OLUWADEMILADE SANDE MRN: [id number] Date of Birth: [birth year]

## 2018-03-19 ENCOUNTER — Ambulatory Visit: Payer: 59

## 2018-03-19 DIAGNOSIS — R293 Abnormal posture: Secondary | ICD-10-CM

## 2018-03-19 DIAGNOSIS — M546 Pain in thoracic spine: Secondary | ICD-10-CM | POA: Diagnosis not present

## 2018-03-19 DIAGNOSIS — M6281 Muscle weakness (generalized): Secondary | ICD-10-CM

## 2018-03-19 NOTE — Therapy (Signed)
[facility] [address], [address] [place], [place], [address] Phone: [phone]   Fax:  [phone]  Physical Therapy Treatment  Patient Details  Name: Rebekah JewettClaudia P Jeune MRN: [id number] Date of Birth: 15-Jul-1975 Referring Provider (PT): Via, Caryn BeeKevin, MD   Encounter Date: 03/19/2018  PT End of Session - 03/19/18 1227    Visit Number  10    Date for PT Re-Evaluation  04/21/18    Authorization Type  [employer]    PT Start Time  1145    PT Stop Time  1227    PT Time Calculation (min)  42 min    Activity Tolerance  Patient tolerated treatment well;No increased pain    Behavior During Therapy  WFL for tasks assessed/performed       Past Medical History:  Diagnosis Date  . Anemia   . Family hx of colon cancer    Mother  . History of Helicobacter pylori infection    WHEN SHE WAS 18    History reviewed. No pertinent surgical history.  There were no vitals filed for this visit.  Subjective Assessment - 03/19/18 1152    Subjective  I am feeling much better.  I was a lillte sore after last session.  Not too bad.      Currently in Pain?  No/denies                       [facility] Adult PT Treatment/Exercise - 03/19/18 0001      Neck Exercises: Machines for Strengthening   UBE (Upper Arm Bike)  Level 1x 6 minutes      Neck Exercises: Standing   Other Standing Exercises  snow angels on wall with cervical retraction and scapular retraction x10      Shoulder Exercises: Supine   Horizontal ABduction  Strengthening;Both;10 reps;Theraband   2x10 on foam roll    Theraband Level (Shoulder Horizontal ABduction)  Level 2 (Red)    External Rotation  Strengthening;Both;20 reps;Theraband   on foam roll   Theraband Level (Shoulder External Rotation)  Level 2 (Red)    Diagonals  Strengthening;Both;Theraband;20 reps    Theraband Level (Shoulder Diagonals)  Level 2 (Red)      Shoulder Exercises: Seated   Flexion   Strengthening;Both;20 reps;Weights    Flexion Weight (lbs)  1   focus on scapular retraction, seated on green ball   Abduction  Strengthening;Both;20 reps;Weights    ABduction Weight (lbs)  1   seated on green ball   Diagonals  Strengthening;Both;20 reps;Weights    Diagonals Weight (lbs)  1   seated on ball     Shoulder Exercises: Prone   Flexion  Strengthening;Both;20 reps    Extension  Strengthening;Both;20 reps    Horizontal ABduction 1  Strengthening;Right;Left;Both;20 reps               PT Short Term Goals - 01/29/18 1227      PT SHORT TERM GOAL #1   Title  Pt will be ind in initial HEP    Time  4    Period  Weeks    Status  Achieved      PT SHORT TERM GOAL #2   Title  Pt will report improved tolerance of daily tasks with pain rating not to exceed 5/10 at least 3 days a week.    Time  4    Period  Weeks    Status  Achieved  PT Long Term Goals - 03/10/18 1108      PT LONG TERM GOAL #1   Title  Pt will be independent in advanced HEP.    Time  6    Period  Weeks    Status  On-going    Target Date  04/21/18      PT LONG TERM GOAL #2   Title  Pt will report pain < or = 3/10 at least 3 days a week to demonstrate improved tolerance of daily demands of child rearing and house upkeep.    Status  Achieved      PT LONG TERM GOAL #3   Title  Pt will demonstrate cervical ROM to WNL without pain to improve ease while driving.    Baseline  all WNL    Status  Achieved      PT LONG TERM GOAL #4   Title  Pt will receive 5/5 manual muscle test scores for all cervical and bil UEs.    Baseline  4+/5    Time  6    Period  Weeks    Status  On-going    Target Date  04/21/18      PT LONG TERM GOAL #5   Title  reduce FOTO to < 26% limitation    Time  6    Period  Weeks    Status  New    Target Date  04/21/18      Additional Long Term Goals   Additional Long Term Goals  Yes      PT LONG TERM GOAL #6   Title  report no sleep interruptions due to  neck/thoracic pain    Time  6    Period  Weeks    Status  New    Target Date  04/21/18            Plan - 03/19/18 1201    Clinical Impression Statement  Pt with reduced pain today so was able to participate in higher level exercise.  Pt remains weak in her core and postural stabilizing muscles and demonstrated instability with supine strength exercise on foam roll.  Pt requires minor, intermittent verbal cues for technique with prone exercises.  Pt reports the most pain in the thoracic spine at the end of the day with fatigue.  Pt will continue to benefit from skilled PT for strength advancement to address functional strength, endurance and flexibility.      Rehab Potential  Excellent    PT Frequency  2x / week    PT Duration  8 weeks    PT Treatment/Interventions  ADLs/Self Care Home Management;Cryotherapy;Electrical Stimulation;Moist Heat;Traction;Functional mobility training;Therapeutic activities;Therapeutic exercise;Neuromuscular re-education;Patient/family education;Manual techniques;Passive range of motion;Dry needling;Spinal Manipulations;Joint Manipulations    PT Next Visit Plan  postural strength and endurance, manual therapy as needed    PT Home Exercise Plan  Access Code: [email or web address]    Consulted and Agree with Plan of Care  Patient       Patient will benefit from skilled therapeutic intervention in order to improve the following deficits and impairments:  Improper body mechanics, Pain, Decreased mobility, Increased muscle spasms, Postural dysfunction, Decreased activity tolerance, Decreased endurance, Decreased range of motion, Decreased strength, Impaired flexibility  Visit Diagnosis: Pain in thoracic spine  Abnormal posture  Muscle weakness (generalized)     Problem List Patient Active Problem List   Diagnosis Date Noted  . Dietary iron deficiency without anemia 01/26/2018  . Esophageal reflux 03/21/2015  . Family history of  colon cancer 03/21/2015    [name]  , PT 03/19/18 12:28 PM  [facility] [address], [address] [place], [place], [address] Phone: [phone]   Fax:  [phone]  Name: Rebekah JewettClaudia P Lanes MRN: [id number] Date of Birth: 06-08-1975

## 2018-03-24 ENCOUNTER — Ambulatory Visit: Payer: 59

## 2018-03-24 DIAGNOSIS — R293 Abnormal posture: Secondary | ICD-10-CM

## 2018-03-24 DIAGNOSIS — M546 Pain in thoracic spine: Secondary | ICD-10-CM

## 2018-03-24 DIAGNOSIS — M6281 Muscle weakness (generalized): Secondary | ICD-10-CM

## 2018-03-24 NOTE — Therapy (Signed)
[facility] [address], [address] [place], [place], [address] Phone: [phone]   Fax:  [phone]  Physical Therapy Treatment  Patient Details  Name: Rebekah JewettClaudia P Kross MRN: [id number] Date of Birth: October 20, 1975 Referring Provider (PT): Via, Caryn BeeKevin, MD   Encounter Date: 03/24/2018  PT End of Session - 03/24/18 1227    Visit Number  11    Date for PT Re-Evaluation  04/21/18    Authorization Type  [employer]    PT Start Time  1149    PT Stop Time  1228    PT Time Calculation (min)  39 min    Activity Tolerance  Patient tolerated treatment well;No increased pain    Behavior During Therapy  WFL for tasks assessed/performed       Past Medical History:  Diagnosis Date  . Anemia   . Family hx of colon cancer    Mother  . History of Helicobacter pylori infection    WHEN SHE WAS 18    History reviewed. No pertinent surgical history.  There were no vitals filed for this visit.  Subjective Assessment - 03/24/18 1152    Subjective  I am doing good.  I have been too sore after exercise at PT.  I just used heat and I felt better.  90% overall improvement since the start of care.      Patient Stated Goals  get stronger and learn how to properly do exercises, get rid of pain, feel less pain by end of day following daily demands of kids' activities, less sleep disturbance, lay on back comfortably    Currently in Pain?  No/denies                       [facility] - 03/24/18 0001      Neck Exercises: Machines for Strengthening   UBE (Upper Arm Bike)  Level 1x 6 minutes   seated on green ball- PT present to discuss progress     Neck Exercises: Standing   Other Standing Exercises  snow angels on wall with cervical retraction and scapular retraction x10      Lumbar Exercises: Sidelying   Other Sidelying Lumbar Exercises  open book stretch x 10 each      Shoulder Exercises: Supine   Horizontal  ABduction  Strengthening;Both;10 reps;Theraband   2x10 on foam roll    Theraband Level (Shoulder Horizontal ABduction)  Level 2 (Red)    External Rotation  Strengthening;Both;20 reps;Theraband   on foam roll   Theraband Level (Shoulder External Rotation)  Level 2 (Red)    Diagonals  Strengthening;Both;Theraband;20 reps    Theraband Level (Shoulder Diagonals)  Level 2 (Red)      Shoulder Exercises: Seated   Flexion  Strengthening;Both;20 reps;Weights    Flexion Weight (lbs)  1   focus on scapular retraction, seated on green ball   Abduction  Strengthening;Both;20 reps;Weights    ABduction Weight (lbs)  1   seated on green ball   Diagonals  Strengthening;Both;20 reps;Weights    Diagonals Weight (lbs)  1   seated on ball     Shoulder Exercises: Prone   Flexion  Strengthening;Both;20 reps    Extension  Strengthening;Both;20 reps    Horizontal ABduction 1  Strengthening;Right;Left;Both;20 reps               PT Short Term Goals - 01/29/18 1227      PT SHORT TERM GOAL #1   Title  Pt will be ind in initial HEP    Time  4    Period  Weeks    Status  Achieved      PT SHORT TERM GOAL #2   Title  Pt will report improved tolerance of daily tasks with pain rating not to exceed 5/10 at least 3 days a week.    Time  4    Period  Weeks    Status  Achieved        PT Long Term Goals - 03/24/18 1157      PT LONG TERM GOAL #2   Title  Pt will report pain < or = 3/10 at least 3 days a week to demonstrate improved tolerance of daily demands of child rearing and house upkeep.    Baseline  no pain with driving    Status  Achieved            Plan - 03/24/18 1201    Clinical Impression Statement  Pt reports 90% overall improvement in symptoms since the start of care.  Pt denies any pain since last session.  Pt denies any pain with driving and reports more endurance with sitting upright.  Pt is able to tolerate advanced strength exercises and requires fewer tactile and verbal  cues.  Pt will continue to benefit from skilled PT for endurance, strength and flexibility.      Rehab Potential  Excellent    PT Frequency  2x / week    PT Duration  8 weeks    PT Treatment/Interventions  ADLs/Self Care Home Management;Cryotherapy;Electrical Stimulation;Moist Heat;Traction;Functional mobility training;Therapeutic activities;Therapeutic exercise;Neuromuscular re-education;Patient/family education;Manual techniques;Passive range of motion;Dry needling;Spinal Manipulations;Joint Manipulations    PT Next Visit Plan  postural strength and endurance, manual therapy as needed    PT Home Exercise Plan  Access Code: [email or web address]    Consulted and Agree with Plan of Care  Patient       Patient will benefit from skilled therapeutic intervention in order to improve the following deficits and impairments:  Improper body mechanics, Pain, Decreased mobility, Increased muscle spasms, Postural dysfunction, Decreased activity tolerance, Decreased endurance, Decreased range of motion, Decreased strength, Impaired flexibility  Visit Diagnosis: Pain in thoracic spine  Abnormal posture  Muscle weakness (generalized)     Problem List Patient Active Problem List   Diagnosis Date Noted  . Dietary iron deficiency without anemia 01/26/2018  . Esophageal reflux 03/21/2015  . Family history of colon cancer 03/21/2015   Lorrene Reid, PT 03/24/18 12:28 PM  [facility] [address], [address] [place], [place], [address] Phone: [phone]   Fax:  [phone]  Name: MALIA BEM MRN: [id number] Date of Birth: 1975/12/26

## 2018-03-26 ENCOUNTER — Ambulatory Visit: Payer: 59

## 2018-03-26 DIAGNOSIS — R293 Abnormal posture: Secondary | ICD-10-CM

## 2018-03-26 DIAGNOSIS — M6281 Muscle weakness (generalized): Secondary | ICD-10-CM

## 2018-03-26 DIAGNOSIS — M546 Pain in thoracic spine: Secondary | ICD-10-CM

## 2018-03-26 NOTE — Therapy (Signed)
[facility] [address], [address] [place], [place], [address] Phone: [phone]   Fax:  [phone]  Physical Therapy Treatment  Patient Details  Name: Rebekah JewettClaudia P Schaffer MRN: [id number] Date of Birth: 1975-06-17 Referring Provider (PT): Via, Caryn BeeKevin, MD   Encounter Date: 03/26/2018  PT End of Session - 03/26/18 1219    Visit Number  12    Date for PT Re-Evaluation  04/21/18    PT Start Time  1145    PT Stop Time  1224    PT Time Calculation (min)  39 min    Activity Tolerance  Patient tolerated treatment well;No increased pain    Behavior During Therapy  WFL for tasks assessed/performed       Past Medical History:  Diagnosis Date  . Anemia   . Family hx of colon cancer    Mother  . History of Helicobacter pylori infection    WHEN SHE WAS 18    History reviewed. No pertinent surgical history.  There were no vitals filed for this visit.  Subjective Assessment - 03/26/18 1150    Subjective  I am doing well.  My endurance and posture are improving.      Patient Stated Goals  get stronger and learn how to properly do exercises, get rid of pain, feel less pain by end of day following daily demands of kids' activities, less sleep disturbance, lay on back comfortably    Currently in Pain?  No/denies                       [facility] Adult PT Treatment/Exercise - 03/26/18 0001      Neck Exercises: Machines for Strengthening   UBE (Upper Arm Bike)  Level 1x 6 minutes   seated on green ball- PT present to discuss progress     Lumbar Exercises: Sidelying   Other Sidelying Lumbar Exercises  open book stretch x 10 each      Shoulder Exercises: Supine   Horizontal ABduction  Strengthening;Both;10 reps;Theraband   2x10 on foam roll    Theraband Level (Shoulder Horizontal ABduction)  Level 3 (Green)    External Rotation  Strengthening;Both;20 reps;Theraband   on foam roll   Theraband Level (Shoulder External  Rotation)  Level 3 (Green)    Diagonals  Strengthening;Both;Theraband;20 reps    Theraband Level (Shoulder Diagonals)  Level 3 (Green)      Shoulder Exercises: Seated   Flexion  Strengthening;Both;20 reps;Weights    Flexion Weight (lbs)  2   focus on scapular retraction, seated on green ball   Abduction  Strengthening;Both;20 reps;Weights    ABduction Weight (lbs)  2   seated on green ball   Diagonals  Strengthening;Both;20 reps;Weights    Diagonals Weight (lbs)  2   seated on ball     Shoulder Exercises: Prone   Flexion  Strengthening;Both;20 reps    Extension  Strengthening;Both;20 reps    Horizontal ABduction 1  Strengthening;Right;Left;Both;20 reps               PT Short Term Goals - 01/29/18 1227      PT SHORT TERM GOAL #1   Title  Pt will be ind in initial HEP    Time  4    Period  Weeks    Status  Achieved      PT SHORT TERM GOAL #2   Title  Pt will report improved tolerance of daily tasks with pain rating not to exceed  5/10 at least 3 days a week.    Time  4    Period  Weeks    Status  Achieved        PT Long Term Goals - 03/24/18 1157      PT LONG TERM GOAL #2   Title  Pt will report pain < or = 3/10 at least 3 days a week to demonstrate improved tolerance of daily demands of child rearing and house upkeep.    Baseline  no pain with driving    Status  Achieved            Plan - 03/26/18 1202    Clinical Impression Statement  Pt is making steady gains with PT.  She reports infrequent periods of feeling fatigued at the end of a day.  Pt is able to advance to green theraband wit supine exercise and 2# with seated exercise and demonstrated increased speed on the arm bike.  Pt is able to maintain upright posture with sitting on the ball during exercise and reports that she was able to do upper body weights at home with back against the wall.  Pt continues to be challenged with prone exercise.  Pt will continue to benefit from skilled PT for postural  strength, flexibility and endurance to continue to improve tolerance for daily activity.      Rehab Potential  Excellent    PT Frequency  2x / week    PT Duration  8 weeks    PT Treatment/Interventions  ADLs/Self Care Home Management;Cryotherapy;Electrical Stimulation;Moist Heat;Traction;Functional mobility training;Therapeutic activities;Therapeutic exercise;Neuromuscular re-education;Patient/family education;Manual techniques;Passive range of motion;Dry needling;Spinal Manipulations;Joint Manipulations    PT Next Visit Plan  postural strength and endurance, manual therapy as needed    PT Home Exercise Plan  Access Code: [email or web address]    Consulted and Agree with Plan of Care  Patient       Patient will benefit from skilled therapeutic intervention in order to improve the following deficits and impairments:  Improper body mechanics, Pain, Decreased mobility, Increased muscle spasms, Postural dysfunction, Decreased activity tolerance, Decreased endurance, Decreased range of motion, Decreased strength, Impaired flexibility  Visit Diagnosis: Pain in thoracic spine  Abnormal posture  Muscle weakness (generalized)     Problem List Patient Active Problem List   Diagnosis Date Noted  . Dietary iron deficiency without anemia 01/26/2018  . Esophageal reflux 03/21/2015  . Family history of colon cancer 03/21/2015    [name] , PT 03/26/18 12:20 PM  [facility] [address], [address] [place], [place], [address] Phone: [phone]   Fax:  [phone]  Name: Rebekah JewettClaudia P Fier MRN: [id number] Date of Birth: Feb 03, 1976

## 2018-03-29 ENCOUNTER — Other Ambulatory Visit: Payer: Self-pay | Admitting: Hematology and Oncology

## 2018-03-31 ENCOUNTER — Ambulatory Visit: Payer: 59

## 2018-03-31 DIAGNOSIS — R293 Abnormal posture: Secondary | ICD-10-CM

## 2018-03-31 DIAGNOSIS — M6281 Muscle weakness (generalized): Secondary | ICD-10-CM

## 2018-03-31 DIAGNOSIS — M546 Pain in thoracic spine: Secondary | ICD-10-CM | POA: Diagnosis not present

## 2018-03-31 NOTE — Therapy (Signed)
[facility] [address], [address] [place], [place], [address] Phone: [phone]   Fax:  [phone]  Physical Therapy Treatment  Patient Details  Name: Rebekah Sanchez MRN: [id number] Date of Birth: August 15, 1975 Referring Provider (Rebekah Sanchez): Via, Caryn BeeKevin, MD   Encounter Date: 03/31/2018  Rebekah Sanchez End of Session - 03/31/18 1223    Visit Number  13    Date for Rebekah Sanchez Re-Evaluation  04/21/18    Rebekah Sanchez Start Time  1144    Rebekah Sanchez Stop Time  1222    Rebekah Sanchez Time Calculation (min)  38 min    Activity Tolerance  Patient tolerated treatment well;No increased pain    Behavior During Therapy  WFL for tasks assessed/performed       Past Medical History:  Diagnosis Date  . Anemia   . Family hx of colon cancer    Mother  . History of Helicobacter pylori infection    WHEN SHE WAS 18    History reviewed. No pertinent surgical history.  There were no vitals filed for this visit.  Subjective Assessment - 03/31/18 1148    Subjective  I [place] in [place] car to [place] this weekend and I didn't have any increased pain.  98% overall improvement.      Patient Stated Goals  get stronger and learn how to properly do exercises, get rid of pain, feel less pain by end of day following daily demands of kids' activities, less sleep disturbance, lay on back comfortably    Currently in Pain?  No/denies                       [facility] Adult Rebekah Sanchez Treatment/Exercise - 03/31/18 0001      Neck Exercises: Machines for Strengthening   UBE (Upper Arm Bike)  Level 1x 6 minutes   seated on green ball- Rebekah Sanchez present to discuss progress     Lumbar Exercises: Sidelying   Other Sidelying Lumbar Exercises  open book stretch x 10 each      Shoulder Exercises: Supine   Horizontal ABduction  Strengthening;Both;10 reps;Theraband   2x10 on foam roll    Theraband Level (Shoulder Horizontal ABduction)  Level 3 (Green)    External Rotation  Strengthening;Both;20 reps;Theraband   on foam roll    Theraband Level (Shoulder External Rotation)  Level 3 (Green)    Diagonals  Strengthening;Both;Theraband;20 reps    Theraband Level (Shoulder Diagonals)  Level 3 (Green)      Shoulder Exercises: Seated   Flexion  Strengthening;Both;20 reps;Weights    Flexion Weight (lbs)  2   focus on scapular retraction, seated on green ball   Abduction  Strengthening;Both;20 reps;Weights    ABduction Weight (lbs)  2   seated on green ball   Diagonals  Strengthening;Both;20 reps;Weights    Diagonals Weight (lbs)  2   seated on ball     Shoulder Exercises: Prone   Flexion  Strengthening;Both;20 reps    Extension  Strengthening;Both;20 reps    Horizontal ABduction 1  Strengthening;Right;Left;Both;20 reps               Rebekah Sanchez Short Term Goals - 01/29/18 1227      Rebekah Sanchez SHORT TERM GOAL #1   Title  Rebekah Sanchez will be ind in initial HEP    Time  4    Period  Weeks    Status  Achieved      Rebekah Sanchez SHORT TERM GOAL #2   Title  Rebekah Sanchez will report improved tolerance  of daily tasks with pain rating not to exceed 5/10 at least 3 days a week.    Time  4    Period  Weeks    Status  Achieved        Rebekah Sanchez Long Term Goals - 03/24/18 1157      Rebekah Sanchez LONG TERM GOAL #2   Title  Rebekah Sanchez will report pain < or = 3/10 at least 3 days a week to demonstrate improved tolerance of daily demands of child rearing and house upkeep.    Baseline  no pain with driving    Status  Achieved            Plan - 03/31/18 1155    Clinical Impression Statement  Rebekah Sanchez is making steady gains with Rebekah Sanchez and reports 98% improvement in her pain.  She reports infrequent periods of feeling fatigued at the end of a day.  Rebekah Sanchez is able to maintain upright posture with sitting on the ball during exercise and reports that she was able to do upper body weights at home with back against the wall.  Rebekah Sanchez continues to be challenged with prone exercise. Rebekah Sanchez demonstrates good technique with exercise with increased resistance over the past 2 sessions.  Rebekah Sanchez will continue to  benefit from skilled Rebekah Sanchez for postural strength, flexibility and endurance to continue to improve tolerance for daily activity.       Rehab Potential  Excellent    Rebekah Sanchez Frequency  2x / week    Rebekah Sanchez Duration  8 weeks    Rebekah Sanchez Treatment/Interventions  ADLs/Self Care Home Management;Cryotherapy;Electrical Stimulation;Moist Heat;Traction;Functional mobility training;Therapeutic activities;Therapeutic exercise;Neuromuscular re-education;Patient/family education;Manual techniques;Passive range of motion;Dry needling;Spinal Manipulations;Joint Manipulations    Rebekah Sanchez Next Visit Plan  postural strength and endurance, manual therapy as needed    Rebekah Sanchez Home Exercise Plan  Access Code: [email or web address]       Patient will benefit from skilled therapeutic intervention in order to improve the following deficits and impairments:  Improper body mechanics, Pain, Decreased mobility, Increased muscle spasms, Postural dysfunction, Decreased activity tolerance, Decreased endurance, Decreased range of motion, Decreased strength, Impaired flexibility  Visit Diagnosis: Pain in thoracic spine  Abnormal posture  Muscle weakness (generalized)     Problem List Patient Active Problem List   Diagnosis Date Noted  . Dietary iron deficiency without anemia 01/26/2018  . Esophageal reflux 03/21/2015  . Family history of colon cancer 03/21/2015    Rebekah Sanchez, Rebekah Sanchez 03/31/18 12:24 PM  [facility] [address] [place], [place], [address] Phone: [phone]   Fax:  [phone]  Name: Rebekah Sanchez MRN: [id number] Date of Birth: 23-Apr-1975

## 2018-04-02 ENCOUNTER — Telehealth: Payer: Self-pay | Admitting: *Deleted

## 2018-04-02 ENCOUNTER — Ambulatory Visit: Payer: 59

## 2018-04-02 DIAGNOSIS — M546 Pain in thoracic spine: Secondary | ICD-10-CM

## 2018-04-02 DIAGNOSIS — R293 Abnormal posture: Secondary | ICD-10-CM

## 2018-04-02 DIAGNOSIS — M6281 Muscle weakness (generalized): Secondary | ICD-10-CM

## 2018-04-02 NOTE — Therapy (Signed)
[facility] [address] [place], [place], [address] Phone: [phone]   Fax:  [phone]  Physical Therapy Treatment  Patient Details  Name: RHIANNAN MODZELEWSKI MRN: [id number] Date of Birth: 11/17/1975 Referring Provider (PT): Via, Caryn Bee, MD   Encounter Date: 04/02/2018  PT End of Session - 04/02/18 1219    Visit Number  14    Date for PT Re-Evaluation  04/21/18    Authorization Type  [employer]    PT Start Time  1145    PT Stop Time  1220    PT Time Calculation (min)  35 min    Activity Tolerance  Patient tolerated treatment well;No increased pain    Behavior During Therapy  WFL for tasks assessed/performed       Past Medical History:  Diagnosis Date  . Anemia   . Family hx of colon cancer    Mother  . History of Helicobacter pylori infection    WHEN SHE WAS 18    No past surgical history on file.  There were no vitals filed for this visit.  Subjective Assessment - 04/02/18 1153    Subjective  I am feeling good.  I continue to feel stronger.    Patient Stated Goals  get stronger and learn how to properly do exercises, get rid of pain, feel less pain by end of day following daily demands of kids' activities, less sleep disturbance, lay on back comfortably                       [facility] Adult PT Treatment/Exercise - 04/02/18 0001      Neck Exercises: Machines for Strengthening   UBE (Upper Arm Bike)  Level 1x 6 minutes   seated on green ball- PT present to discuss progress     Lumbar Exercises: Sidelying   Other Sidelying Lumbar Exercises  open book stretch x 10 each      Shoulder Exercises: Supine   Horizontal ABduction  Strengthening;Both;10 reps;Theraband   2x10 on foam roll    Theraband Level (Shoulder Horizontal ABduction)  Level 4 (Blue)    External Rotation  Strengthening;Both;20 reps;Theraband   on foam roll   Theraband Level (Shoulder External Rotation)  Level 4 (Blue)     Diagonals  Strengthening;Both;Theraband;20 reps    Theraband Level (Shoulder Diagonals)  Level 4 (Blue)      Shoulder Exercises: Seated   Horizontal ABduction  Strengthening;Both;Theraband;20 reps    Theraband Level (Shoulder Horizontal ABduction)  Level 2 (Red)    External Rotation  Strengthening;Left;20 reps;Theraband    Theraband Level (Shoulder External Rotation)  Level 2 (Red)    Diagonals  Strengthening;Both;20 reps;Theraband    Theraband Level (Shoulder Diagonals)  Level 2 (Red)      Shoulder Exercises: Prone   Flexion  Strengthening;Both;20 reps    Extension  Strengthening;Both;20 reps    Horizontal ABduction 1  Strengthening;Right;Left;Both;20 reps               PT Short Term Goals - 01/29/18 1227      PT SHORT TERM GOAL #1   Title  Pt will be ind in initial HEP    Time  4    Period  Weeks    Status  Achieved      PT SHORT TERM GOAL #2   Title  Pt will report improved tolerance of daily tasks with pain rating not to exceed 5/10 at least 3 days a week.  Time  4    Period  Weeks    Status  Achieved        PT Long Term Goals - 03/24/18 1157      PT LONG TERM GOAL #2   Title  Pt will report pain < or = 3/10 at least 3 days a week to demonstrate improved tolerance of daily demands of child rearing and house upkeep.    Baseline  no pain with driving    Status  Achieved            Plan - 04/02/18 1155    Clinical Impression Statement  Pt is making steady gains with PT and reports 98% improvement in her pain this week.  She reports infrequent periods of feeling fatigued at the end of a day.  Pt is able to maintain upright posture with sitting on the ball during exercise and has increased speed of rotation on the arm bike.  Pt continues to be challenged with prone exercise although reports increased ease and requires fewer tactile cues. Pt demonstrates good technique with exercise with increased resistance over the past 2 sessions.  Pt will continue to  benefit from skilled PT for postural strength, flexibility and endurance to continue to improve tolerance for daily activity.    Rehab Potential  Excellent    PT Frequency  2x / week    PT Duration  8 weeks    PT Treatment/Interventions  ADLs/Self Care Home Management;Cryotherapy;Electrical Stimulation;Moist Heat;Traction;Functional mobility training;Therapeutic activities;Therapeutic exercise;Neuromuscular re-education;Patient/family education;Manual techniques;Passive range of motion;Dry needling;Spinal Manipulations;Joint Manipulations    PT Next Visit Plan  postural strength and endurance. 1 more session    PT Home Exercise Plan  Access Code: [email or web address]    Consulted and Agree with Plan of Care  Patient       Patient will benefit from skilled therapeutic intervention in order to improve the following deficits and impairments:  Improper body mechanics, Pain, Decreased mobility, Increased muscle spasms, Postural dysfunction, Decreased activity tolerance, Decreased endurance, Decreased range of motion, Decreased strength, Impaired flexibility  Visit Diagnosis: Abnormal posture  Pain in thoracic spine  Muscle weakness (generalized)     Problem List Patient Active Problem List   Diagnosis Date Noted  . Dietary iron deficiency without anemia 01/26/2018  . Esophageal reflux 03/21/2015  . Family history of colon cancer 03/21/2015    Lorrene Reid, PT 04/02/18 12:21 PM  [facility] [address], [address] [place], [place], [address] Phone: [phone]   Fax:  [phone]  Name: HALINA LAWHORNE MRN: [id number] Date of Birth: 05/05/1975

## 2018-04-02 NOTE — Telephone Encounter (Signed)
FYI Niferex refill request received 03-29-2018 for Rebekah Sanchez discontinued.  Rebekah Sanchez appointment with new provider Rebekah Sanchez; 04-07-2018 at 3:00 pm.  Original order for Niferex 150 mg oral two times daily, quantity 60 with three refills ordered 01-26-2018 not needing refill at this time, discontinued for any orders per new provider.

## 2018-04-03 ENCOUNTER — Other Ambulatory Visit: Payer: Self-pay | Admitting: *Deleted

## 2018-04-03 DIAGNOSIS — D500 Iron deficiency anemia secondary to blood loss (chronic): Secondary | ICD-10-CM

## 2018-04-06 DIAGNOSIS — H5201 Hypermetropia, right eye: Secondary | ICD-10-CM | POA: Diagnosis not present

## 2018-04-06 DIAGNOSIS — H40013 Open angle with borderline findings, low risk, bilateral: Secondary | ICD-10-CM | POA: Diagnosis not present

## 2018-04-07 ENCOUNTER — Ambulatory Visit: Payer: 59 | Admitting: Oncology

## 2018-04-07 ENCOUNTER — Other Ambulatory Visit: Payer: 59

## 2018-04-08 ENCOUNTER — Telehealth: Payer: Self-pay | Admitting: Oncology

## 2018-04-08 NOTE — Telephone Encounter (Signed)
Called patient per 1/27 sch message to r/s appt - left message for patient to call back to r/s

## 2018-04-09 ENCOUNTER — Ambulatory Visit: Payer: 59

## 2018-04-09 DIAGNOSIS — R293 Abnormal posture: Secondary | ICD-10-CM

## 2018-04-09 DIAGNOSIS — M546 Pain in thoracic spine: Secondary | ICD-10-CM

## 2018-04-09 DIAGNOSIS — M6281 Muscle weakness (generalized): Secondary | ICD-10-CM

## 2018-04-09 NOTE — Therapy (Signed)
[facility] [address], [place], [place], [address] Phone: [phone]   Fax:  [phone]  Physical Therapy Treatment  Patient Details  Name: Rebekah Sanchez MRN: [id number] Date of Birth: March 20, 1975 Referring Provider (PT): Via, Lennette Bihari, MD   Encounter Date: 04/09/2018  PT End of Session - 04/09/18 1225    Visit Number  15    PT Start Time  1856    PT Stop Time  3149    PT Time Calculation (min)  35 min    Activity Tolerance  Patient tolerated treatment well;No increased pain    Behavior During Therapy  WFL for tasks assessed/performed       Past Medical History:  Diagnosis Date  . Anemia   . Family hx of colon cancer    Mother  . History of Helicobacter pylori infection    WHEN SHE WAS 18    No past surgical history on file.  There were no vitals filed for this visit.  Subjective Assessment - 04/09/18 1152    Subjective  I had a little bit of Rt thoracic pain last week but it went away.  Ready to D/C.      Patient Stated Goals  get stronger and learn how to properly do exercises, get rid of pain, feel less pain by end of day following daily demands of kids' activities, less sleep disturbance, lay on back comfortably    Currently in Pain?  No/denies    Pain Score  --   Rt upper trap "soreness"        [facility] PT Assessment - 04/09/18 0001      Assessment   Medical Diagnosis  R07.89 (ICD-10-CM) - Other chest pain   M54.9,G89.29 (ICD-10-CM) - Upper back pain, chronic   Referring Provider (PT)  Via, Lennette Bihari, MD    Onset Date/Surgical Date  12/14/17      Prior Function   Level of Independence  Independent      Cognition   Overall Cognitive Status  Within Functional Limits for tasks assessed      Observation/Other Assessments   Focus on Therapeutic Outcomes (FOTO)   26% limitation      Strength   Right Shoulder Flexion  4+/5    Right Shoulder ABduction  4+/5    Right Shoulder External Rotation   4+/5    Left Shoulder Flexion  4+/5    Left Shoulder ABduction  4+/5    Left Shoulder External Rotation  4+/5                   [facility] Adult PT Treatment/Exercise - 04/09/18 0001      Neck Exercises: Machines for Strengthening   UBE (Upper Arm Bike)  Level 1x 6 minutes   seated on green ball- PT present to discuss progress     Lumbar Exercises: Sidelying   Other Sidelying Lumbar Exercises  open book stretch x 10 each      Shoulder Exercises: Supine   Horizontal ABduction  Strengthening;Both;10 reps;Theraband   2x10 on foam roll    Theraband Level (Shoulder Horizontal ABduction)  Level 4 (Blue)    External Rotation  Strengthening;Both;20 reps;Theraband   on foam roll   Theraband Level (Shoulder External Rotation)  Level 4 (Blue)    Diagonals  Strengthening;Both;Theraband;20 reps    Theraband Level (Shoulder Diagonals)  Level 4 (Blue)      Shoulder Exercises: Seated   Flexion  Strengthening;Both;20 reps;Weights    Flexion Weight (  lbs)  2   focus on scapular retraction, seated on green ball   Abduction  Strengthening;Both;20 reps;Weights    ABduction Weight (lbs)  2   seated on green ball   Diagonals  Strengthening;Both;20 reps;Weights    Diagonals Weight (lbs)  2   seated on ball     Shoulder Exercises: Prone   Flexion  Strengthening;Both;20 reps    Extension  Strengthening;Both;20 reps    Horizontal ABduction 1  Strengthening;Right;Left;Both;20 reps               PT Short Term Goals - 01/29/18 1227      PT SHORT TERM GOAL #1   Title  Pt will be ind in initial HEP    Time  4    Period  Weeks    Status  Achieved      PT SHORT TERM GOAL #2   Title  Pt will report improved tolerance of daily tasks with pain rating not to exceed 5/10 at least 3 days a week.    Time  4    Period  Weeks    Status  Achieved        PT Long Term Goals - 04/09/18 1155      PT LONG TERM GOAL #1   Title  Pt will be independent in advanced HEP.    Status  Achieved       PT LONG TERM GOAL #2   Title  Pt will report pain < or = 3/10 at least 3 days a week to demonstrate improved tolerance of daily demands of child rearing and house upkeep.    Status  Achieved      PT LONG TERM GOAL #3   Title  Pt will demonstrate cervical ROM to WNL without pain to improve ease while driving.    Status  Achieved      PT LONG TERM GOAL #4   Title  Pt will receive 5/5 manual muscle test scores for all cervical and bil UEs.    Status  Partially Met      PT LONG TERM GOAL #5   Title  reduce FOTO to < 26% limitation    Baseline  26% limitation    Status  Achieved      PT LONG TERM GOAL #6   Title  report no sleep interruptions due to neck/thoracic pain    Status  Achieved            Plan - 04/09/18 1227    Clinical Impression Statement  Pt reports 98% overall improvement since the start of care.  Pt has a comprehensive HEP in place for postural strength and flexibility.  Pt reports infrequent and intermittent episodes of Rt scapular pain with fatigue.  Pt continues to make postural corrections and stretch regularly.  Pt with normal cervical A/ROM and improved yet continued UE and postural weakness.  Pt will continue with HEP and follow-up with MD as needed.      PT Next Visit Plan  D/C PT     PT Home Exercise Plan  Access Code: [email or web address]    Consulted and Agree with Plan of Care  Patient       Patient will benefit from skilled therapeutic intervention in order to improve the following deficits and impairments:     Visit Diagnosis: Abnormal posture  Pain in thoracic spine  Muscle weakness (generalized)     Problem List Patient Active Problem List   Diagnosis Date Noted  . Dietary  iron deficiency without anemia 01/26/2018  . Esophageal reflux 03/21/2015  . Family history of colon cancer 03/21/2015   PHYSICAL THERAPY DISCHARGE SUMMARY  Visits from Start of Care: 15  Current functional level related to goals / functional outcomes: See above for  current status.  Pt has HEP in place.   Remaining deficits: Intermittent and infrequent thoracic pain when fatigued.  Postural endurance deficits.     Education / Equipment: HEP Plan: Patient agrees to discharge.  Patient goals were met. Patient is being discharged due to meeting the stated rehab goals.  ?????        Sigurd Sos, PT 04/09/18 12:29 PM  [facility] [address] [place], [place], [address] Phone: [phone]   Fax:  [phone]  Name: Rebekah Sanchez MRN: [id number] Date of Birth: 1975/05/27

## 2018-04-16 DIAGNOSIS — D509 Iron deficiency anemia, unspecified: Secondary | ICD-10-CM | POA: Diagnosis not present

## 2018-04-16 DIAGNOSIS — Z0000 Encounter for general adult medical examination without abnormal findings: Secondary | ICD-10-CM | POA: Diagnosis not present

## 2018-04-30 ENCOUNTER — Telehealth: Payer: Self-pay | Admitting: Oncology

## 2018-04-30 NOTE — Telephone Encounter (Signed)
Called patient per scheduling VM log and rescheduled appt from 01/28.  Patient aware of appt date and time.

## 2018-05-15 ENCOUNTER — Encounter: Payer: Self-pay | Admitting: *Deleted

## 2018-05-15 ENCOUNTER — Inpatient Hospital Stay: Payer: 59 | Admitting: Oncology

## 2018-05-15 ENCOUNTER — Other Ambulatory Visit: Payer: Self-pay

## 2018-05-15 ENCOUNTER — Inpatient Hospital Stay: Payer: 59 | Attending: Hematology and Oncology

## 2018-05-15 VITALS — BP 113/70 | HR 75 | Temp 97.8°F | Resp 18 | Ht 62.0 in | Wt 120.5 lb

## 2018-05-15 DIAGNOSIS — O039 Complete or unspecified spontaneous abortion without complication: Secondary | ICD-10-CM | POA: Insufficient documentation

## 2018-05-15 DIAGNOSIS — E611 Iron deficiency: Secondary | ICD-10-CM | POA: Insufficient documentation

## 2018-05-15 DIAGNOSIS — D500 Iron deficiency anemia secondary to blood loss (chronic): Secondary | ICD-10-CM

## 2018-05-15 LAB — CBC WITH DIFFERENTIAL (CANCER CENTER ONLY)
Abs Immature Granulocytes: 0.02 10*3/uL (ref 0.00–0.07)
Basophils Absolute: 0 10*3/uL (ref 0.0–0.1)
Basophils Relative: 1 %
Eosinophils Absolute: 0.6 10*3/uL — ABNORMAL HIGH (ref 0.0–0.5)
Eosinophils Relative: 8 %
HCT: 39.4 % (ref 36.0–46.0)
Hemoglobin: 12.5 g/dL (ref 12.0–15.0)
IMMATURE GRANULOCYTES: 0 %
LYMPHS ABS: 1.6 10*3/uL (ref 0.7–4.0)
Lymphocytes Relative: 21 %
MCH: 29.1 pg (ref 26.0–34.0)
MCHC: 31.7 g/dL (ref 30.0–36.0)
MCV: 91.8 fL (ref 80.0–100.0)
Monocytes Absolute: 0.7 10*3/uL (ref 0.1–1.0)
Monocytes Relative: 9 %
Neutro Abs: 4.7 10*3/uL (ref 1.7–7.7)
Neutrophils Relative %: 61 %
PLATELETS: 350 10*3/uL (ref 150–400)
RBC: 4.29 MIL/uL (ref 3.87–5.11)
RDW: 13.2 % (ref 11.5–15.5)
WBC Count: 7.7 10*3/uL (ref 4.0–10.5)
nRBC: 0.3 % — ABNORMAL HIGH (ref 0.0–0.2)

## 2018-05-15 LAB — FERRITIN: Ferritin: 12 ng/mL (ref 11–307)

## 2018-05-15 NOTE — Progress Notes (Signed)
  [facility] OFFICE PROGRESS NOTE   Diagnosis: Iron deficiency  INTERVAL HISTORY:   Ms. Arner was referred to Dr. Caron Presume for evaluation of iron deficiency.  Ferritin returned low at 13 on 01/09/2018.  She is taking oral iron.  She denies bleeding other than the monthly menstrual cycle.  No complaint today.  Her mother had colon cancer.  She underwent a colonoscopy 05/30/2017.  This was a negative study.  Objective:  Vital signs in last 24 hours:  Blood pressure 113/70, pulse 75, temperature 97.8 F (36.6 C), temperature source Oral, resp. rate 18, height 5\' 2"  (1.575 m), weight 120 lb 8 oz (54.7 kg), SpO2 100 %.   Resp: Lungs clear bilaterally Cardio: Regular rate and rhythm GI: No hepatosplenomegaly, nontender Vascular: No leg edema   Lab Results:  Lab Results  Component Value Date   WBC 7.7 05/15/2018   HGB 12.5 05/15/2018   HCT 39.4 05/15/2018   MCV 91.8 05/15/2018   PLT 350 05/15/2018   NEUTROABS 4.7 05/15/2018    CMP  Lab Results  Component Value Date   NA 140 02/10/2013   K 4.0 02/10/2013   CL 106 02/10/2013   CO2 26 02/10/2013   GLUCOSE 121 (H) 02/10/2013   BUN 16 02/10/2013   CREATININE 0.5 02/10/2013   CALCIUM 9.4 02/10/2013   PROT 7.5 02/10/2013   ALBUMIN 4.1 02/10/2013   AST 19 02/10/2013   ALT 18 02/10/2013   ALKPHOS 39 02/10/2013   BILITOT 0.4 02/10/2013    No results found for: CEA1  No results found for: INR  Imaging:  No results found.  Medications: I have reviewed the patient's current medications.   Assessment/Plan: 1.  History of iron deficiency- likely related to menstrual blood loss, negative colonoscopy in March 2019 2.  G3, P2, 1 miscarriage, continues regular monthly menses 3.  Family history of colon and ovarian cancer- she reports undergoing genetic testing via her gynecologist   Disposition: Ms. Tong has a history of iron deficiency.  She is not anemic.  The iron deficiency was likely related to  menstrual blood loss.  She underwent a negative colonoscopy in March 2019.  I recommended she continue iron replacement while having menses.   She has an extensive family history of cancer including colon cancer in her mother.  She reports ovarian cancer in her maternal grandmother, a maternal aunt, and a maternal cousin.  She believes she is undergone genetic testing via Dr. Ernestina Penna.  We will confirm this.  If she has not we will make a referral to the genetics counselor.  She plans to continue clinical follow-up with Dr. Barbaraann Barthel and Dr. Ernestina Penna.  We will be available to see her in the future as needed.  Thornton Papas, MD  05/15/2018  9:31 AM

## 2018-05-15 NOTE — Progress Notes (Unsigned)
Sent request to Dr. Viviann Spare (GYN) requesting results of genetic testing done through her office at request of Dr. Truett Perna.

## 2018-05-18 ENCOUNTER — Other Ambulatory Visit: Payer: Self-pay | Admitting: Hematology and Oncology

## 2018-05-21 ENCOUNTER — Other Ambulatory Visit: Payer: Self-pay | Admitting: *Deleted

## 2018-05-21 MED ORDER — FERREX 150 150 MG PO CAPS: 150.0000 mg | ORAL_CAPSULE | Freq: Two times a day (BID) | ORAL | 0 refills | Status: DC

## 2018-05-21 NOTE — Telephone Encounter (Signed)
Dr. Truett Perna, please advise refill.

## 2018-07-03 ENCOUNTER — Telehealth: Payer: Self-pay | Admitting: Genetic Counselor

## 2018-07-03 ENCOUNTER — Telehealth: Payer: Self-pay | Admitting: *Deleted

## 2018-07-03 DIAGNOSIS — D500 Iron deficiency anemia secondary to blood loss (chronic): Secondary | ICD-10-CM

## 2018-07-03 NOTE — Telephone Encounter (Signed)
Called patient with recommendations from Dr. Truett Perna to see genetics counselor if she has not already done so. Explained what information she may learn from the visit and she agrees, but would like to wait until June or July. Scheduling message sent.

## 2018-07-03 NOTE — Telephone Encounter (Signed)
Scheduled appt per 4/24 sch message - sent reminder letter in the mail.

## 2018-09-17 ENCOUNTER — Telehealth: Payer: Self-pay | Admitting: Genetic Counselor

## 2018-09-17 NOTE — Telephone Encounter (Signed)
Called patient regarding upcoming [facility] appointment, patient is notified and e-mail has been sent. °

## 2018-09-21 ENCOUNTER — Inpatient Hospital Stay: Payer: 59 | Attending: Genetic Counselor | Admitting: Genetic Counselor

## 2018-09-21 ENCOUNTER — Encounter: Payer: Self-pay | Admitting: Genetic Counselor

## 2018-09-21 ENCOUNTER — Inpatient Hospital Stay: Payer: 59

## 2018-09-21 DIAGNOSIS — Z803 Family history of malignant neoplasm of breast: Secondary | ICD-10-CM | POA: Diagnosis not present

## 2018-09-21 DIAGNOSIS — Z800 Family history of malignant neoplasm of digestive organs: Secondary | ICD-10-CM

## 2018-09-21 DIAGNOSIS — Z8041 Family history of malignant neoplasm of ovary: Secondary | ICD-10-CM

## 2018-09-21 NOTE — Progress Notes (Signed)
REFERRING PROVIDER: Ladell Pier, MD [address] [place],  [place] [address]  PRIMARY PROVIDER:  Aretta Nip, MD  PRIMARY REASON FOR VISIT:  1. Family history of colon cancer   2. Family history of breast cancer   3. Family history of ovarian cancer   4. Family history of stomach cancer      HISTORY OF PRESENT ILLNESS:    I connected with Rebekah Sanchez on 09/21/2018 at 1:15 PM EDT by Webex video conference and verified that I am speaking with the correct person using two identifiers.   Patient location: Home Provider location: Work   Rebekah Sanchez, a 43 y.o. female, was seen for a [facility] cancer genetics consultation at the request of Dr. Benay Spice due to a family history of cancer.  Rebekah Sanchez presents to clinic today to discuss the possibility of a hereditary predisposition to cancer, genetic testing, and to further clarify her future cancer risks, as well as potential cancer risks for family members.   Rebekah Sanchez is a 43 y.o. female with no personal history of cancer.    CANCER HISTORY:  Oncology History   No history exists.     RISK FACTORS:  Menarche was at age 57.  First live birth at age 61.  Ovaries intact: no.  Hysterectomy: no.  Menopausal status: premenopausal.  HRT use: 0 years. Colonoscopy: yes; normal. Mammogram within the last year: yes. Number of breast biopsies: 0. Up to date with pelvic exams: yes. Any excessive radiation exposure in the past: no  Past Medical History:  Diagnosis Date   Anemia    Family history of breast cancer    Family history of ovarian cancer    Family history of stomach cancer    Family hx of colon cancer    Mother   History of Helicobacter pylori infection    WHEN SHE WAS 18    No past surgical history on file.  Social History   Socioeconomic History   Marital status: Married    Spouse name: Not on file   Number of children: Not on file   Years of education: Not on file   Highest  education level: Not on file  Occupational History   Not on file  Social Needs   Financial resource strain: Not on file   Food insecurity    Worry: Not on file    Inability: Not on file   Transportation needs    Medical: Not on file    Non-medical: Not on file  Tobacco Use   Smoking status: Never Smoker   Smokeless tobacco: Never Used  Substance and Sexual Activity   Alcohol use: No   Drug use: No   Sexual activity: Not on file  Lifestyle   Physical activity    Days per week: Not on file    Minutes per session: Not on file   Stress: Not on file  Relationships   Social connections    Talks on phone: Not on file    Gets together: Not on file    Attends religious service: Not on file    Active member of club or organization: Not on file    Attends meetings of clubs or organizations: Not on file    Relationship status: Not on file  Other Topics Concern   Not on file  Social History Narrative   Not on file     FAMILY HISTORY:  We obtained a detailed, 4-generation family history.  Significant diagnoses are  listed below: Family History  Problem Relation Age of Onset   Colon cancer Mother 6   Ovarian cancer Maternal Grandmother 23       d. 8   Breast cancer Maternal Aunt 45   Ovarian cancer Cousin        mother's mat first cousin   Kidney failure Father    Bone cancer Paternal Grandmother 19   Ovarian cancer Other        MGMs sister   Stomach cancer Other        MGMs sister    The patient has a son and daughter who are cancer free.  She has two brothers and a sister who are cancer free.  Her father is deceased and her mother is living.  The patient's father died from kidney failure.  He was an only child.  His mother died from bone cancer in her knee at age 22.  His father died of natural causes.  The patient's mother had colon cancer at 47.  She had a brother and three sisters.  One sister had breast cancer at 77.  The maternal grandparents  are deceased.  The grandmother had ovarian cancer at 53 and died at 20.  She has several sisters, one had stomach cancer and one had breast cancer.  One of these women had a daughter with breast cancer.  Rebekah Sanchez is unaware of previous family history of genetic testing for hereditary cancer risks. Patient's maternal ancestors are of African/Spanish descent, and paternal ancestors are of African/Spanish and Native [place] descent. There is no reported Ashkenazi Jewish ancestry. There is no known consanguinity.    GENETIC COUNSELING ASSESSMENT: Rebekah Sanchez is a 43 y.o. female with a family history of cancer which is somewhat suggestive of Lynch syndrome and predisposition to cancer. We, therefore, discussed and recommended the following at today's visit.   DISCUSSION: We discussed that 5 - 10% of cancer is hereditary.  Based on the cancer in her family, I am most concerned about Lynch syndrome due to the colon, ovarian and stomach cancers.  However, the breast and ovarian cancer in the family are young, and therefore could be associated with a hereditary breast cancer syndrome such as those due to BRCA mutations.  There are other genes that can be associated with hereditary breast, ovarian or colon cancer syndromes.  Rebekah Sanchez states that she thinks she was tested through her OB/GYN office. We have called for those records.  We reviewed the characteristics, features and inheritance patterns of hereditary cancer syndromes. We also discussed genetic testing, including the appropriate family members to test, the process of testing, insurance coverage and turn-around-time for results. We discussed the implications of a negative, positive and/or variant of uncertain significant result. We recommended Rebekah Sanchez pursue genetic testing for the common hereditary cancer gene panel. The Common Hereditary Gene Panel offered by [facility] includes sequencing and/or deletion duplication testing of the following 48  genes: APC, ATM, AXIN2, BARD1, BMPR1A, BRCA1, BRCA2, BRIP1, CDH1, CDK4, CDKN2A (p14ARF), CDKN2A (p16INK4a), CHEK2, CTNNA1, DICER1, EPCAM (Deletion/duplication testing only), GREM1 (promoter region deletion/duplication testing only), KIT, MEN1, MLH1, MSH2, MSH3, MSH6, MUTYH, NBN, NF1, NHTL1, PALB2, PDGFRA, PMS2, POLD1, POLE, PTEN, RAD50, RAD51C, RAD51D, RNF43, SDHB, SDHC, SDHD, SMAD4, SMARCA4. STK11, TP53, TSC1, TSC2, and VHL.  The following genes were evaluated for sequence changes only: SDHA and HOXB13 c.251G>A variant only.   Based on Rebekah Sanchez's family history of cancer, she meets medical criteria for genetic testing. Despite that she meets  criteria, she may still have an out of pocket cost. We discussed that if her out of pocket cost for testing is over $100, the laboratory will call and confirm whether she wants to proceed with testing.  If the out of pocket cost of testing is less than $100 she will be billed by the genetic testing laboratory.   Based on the patient's family history, a statistical model [employer] cabin crew) was used to estimate her risk of developing breast cancer. This estimates her lifetime risk of developing breast cancer to be approximately 16.1%. The patient's lifetime breast cancer risk is a preliminary estimate based on available information using one of several models endorsed by the [place] (ACS). The ACS recommends consideration of breast MRI screening as an adjunct to mammography for patients at high risk (defined as 20% or greater lifetime risk). Rebekah Sanchez has a higher risk for breast cancer than the typical patient at age 40, but it is not high enough to warrant a breast MRI for high risk screening.    We discussed that while she meets criteria for genetic testing, she is not the best person in the family to test.  We would recommend testing her mother, who had colon cancer at 33.  Individuals who have cancer are more likely to have hereditary cancer  syndromes than individuals who do not have cancer.  We would recommend this regardless of what Rebekah Sanchez's genetic testing reports out.  If she is negative, maybe her mother has a hereditary cancer syndrome that she did not inherit. If she is positive, we would want to confirm that her mother is also affected, and that it did not come from her father's side of the family.  Her father's side of the family is limited, as he is an only child.  After the patient's appointment, we received a copy of her genetic testing from Dr. Earl Many office. Genetic testing through Dr. Earl Many office was reported out on August 25, 2017 through the Multi-cancer panel found no pathogenic mutations. The Multi-Gene Panel offered by [facility] includes sequencing and/or deletion duplication testing of the following 83 genes: ALK, APC, ATM, AXIN2, BAP1, BARD1, BLM, BMPR1A, BRCA1, BRCA2, BRIP1, CASR, CDC73, CDH1, CDK4, CDKN1B, CDKN1C, CDKN2A (p14ARF), CDKN2A (p16INK4a), CEBPA, CHEK2, CTNNA1, DICER1, DIS3L2, EGFR (c.2369C>T, p.Thr790Met variant only), EPCAM (Deletion/duplication testing only), FH, FLCN, GATA2, GPC3, GREM1 (Promoter region deletion/duplication testing only), HOXB13 (c.251G>A, p.Gly84Glu), HRAS, KIT, MAX, MEN1, MET, MITF (c.952G>A, p.Glu318Lys variant only), MLH1, MSH2, MSH3, MSH6, MUTYH, NBN, NF1, NF2, NTHL1, PALB2, PDGFRA, PHOX2B, PMS2, POLD1, POLE, POT1, PRKAR1A, PTCH1, PTEN, RAD50, RAD51C, RAD51D, RB1, RECQL4, RET, RUNX1, SDHAF2, SDHA (sequence changes only), SDHB, SDHC, SDHD, SMAD4, SMARCA4, SMARCB1, SMARCE1, STK11, SUFU, TERC, TERT, TMEM127, TP53, TSC1, TSC2, VHL, WRN and WT1.    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 They should not be used to make medical management decisions. With time, we suspect the lab will determine the significance of these variants, if any.      PLAN: I discussed with the patient that this is considered a normal test and that no further testing would be needed at this time.  The patient voiced her understanding. 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  This is essentially a negative test, and we would not use the VUS's to change her medical management.  We remain available to coordinate genetic testing at any time in the future, if it would be needed. We, therefore, recommend Rebekah Sanchez continue to follow the cancer screening guidelines given by her primary healthcare provider.  Lastly, we encouraged Rebekah Sanchez to remain in contact with cancer genetics annually so that we can continuously update the family history and inform her of any changes in cancer genetics and testing that may be of benefit for this family.   Rebekah Sanchez questions were answered to her satisfaction today. Our contact information was provided should additional questions or concerns arise. Thank you for the referral and allowing [place] to share in the care of your patient.   Bently Wyss P. Florene Glen, [place], [facility] Certified Genetic Counselor [email or web address] phone: [phone]  The patient was seen for a total of 45 minutes in face-to-face genetic counseling.  This patient was discussed with Drs. Magrinat, Lindi Adie and/or Burr Medico who agrees with the above.    _______________________________________________________________________ For Office Staff:  Number of people involved in session: 1 Was an Intern/ student involved with case: no

## 2018-09-22 ENCOUNTER — Encounter: Payer: Self-pay | Admitting: Genetic Counselor

## 2018-09-23 ENCOUNTER — Telehealth: Payer: Self-pay | Admitting: Genetic Counselor

## 2018-09-23 NOTE — Telephone Encounter (Signed)
Spoke with Rebekah Sanchez about her testing through Dr. Earl Many office. Discussed that testing was essentially normal, but that there were two VUSs identified.  We do not change medical management based on VUS.  I discussed that based on [name] she has a slightly higher risk for breast cancer over the typical 41 YO, but not high enough to warrant a breast MRI.  I will forward my note to Dr. Earl Many office as well as Dr. Gearldine Shown office.

## 2018-09-25 ENCOUNTER — Telehealth: Payer: Self-pay | Admitting: Genetic Counselor

## 2018-09-25 NOTE — Telephone Encounter (Signed)
Spoke with Dr. Jerrilyn Cairo nurse, Santiago Glad.  Explained that one of the VUS' identified on the original testing is concerning for being pathogenic. I called [facility] and they are on the cusp of possibly upgrading it, and other laboratories call this variant pathogenic. [facility] will offer RNA testing to look at the variant a different way and see if they can help clarify whether they call it pathogenic or not. Since their office is who had originally initiated testing, if we do RNA testing, any reclassification will come to their office.  I wanted to talk with Dr. Pamala Hurry about this and work with her, since they would get this reclassification and I wanted her to know that this could happen rather than having it happen unexpectedly.    Dr. Pamala Hurry is out of the office through next Thursday.  Santiago Glad will leave a message for her about this case.  If she sees her messages before she returns she may respond. Otherwise it will be next week before we hear from her.

## 2018-09-29 ENCOUNTER — Telehealth: Payer: Self-pay | Admitting: Genetic Counselor

## 2018-09-29 ENCOUNTER — Encounter: Payer: Self-pay | Admitting: Genetic Counselor

## 2018-09-29 NOTE — Telephone Encounter (Signed)
LM on VM that I have updated information on her testing.  Asked that she please CB.

## 2018-09-29 NOTE — Telephone Encounter (Signed)
I spoke with Rebekah Sanchez.  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  This variant is currently considered pathogenic by other laboratories.  [facility] has researched this again, after talking with me about this earlier this week, and will be reclassifying this as likely pathogenic.  I have been in contact with Dr. Jerrilyn Cairo office and they are aware of this upgrade coming.  Once we have the updated report, we can talk again virtually, and discuss medical management options at that time.   I will contact her again once I have the upgraded report and we can set up a time to talk. Patient voiced her understanding.

## 2018-09-29 NOTE — Progress Notes (Signed)
[facility] contacted me about Ms. Mccaskill's genetic testing. They no longer need a blood sample from her for RNA testing.  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  They will reissue a report, most likely by the end of the week.  I contacted Dr. Elisabeth Pigeon office.  I had been in contact with them over the last week about this variant and the possibility that it will be upgraded.  I spoke with Dr. Jerrilyn Cairo nurse to let her know that they should receive an upgraded report maybe as soon as this week.  I let them know that I will call Ms. Allebach to let her know what has been going on in the background, and that I would be happy to see her once this upgraded report is dropped.  I requested that Dr. Jerrilyn Cairo office please fax me an updated copy of the report once it is released, or add me as a provider in the portal to Ms. Killmer's case.

## 2018-09-30 NOTE — Telephone Encounter (Signed)
Thanks No f/u scheduled here, she can see Dr. Valentino Saxon I will be glad to see her again as needed  Should she go to high risk breast clinic here?

## 2018-10-06 ENCOUNTER — Telehealth: Payer: Self-pay | Admitting: Genetic Counselor

## 2018-10-06 ENCOUNTER — Encounter: Payer: Self-pay | Admitting: Genetic Counselor

## 2018-10-06 DIAGNOSIS — Z1379 Encounter for other screening for genetic and chromosomal anomalies: Secondary | ICD-10-CM | POA: Insufficient documentation

## 2018-10-06 NOTE — Telephone Encounter (Signed)
Discussed that the amended BRCA2 report has come through.  It is now considered a positive genetic test, and therefore we will need to make recommendations for medical management.  We will discuss her report tomorrow, October 07, 2018.

## 2018-10-07 ENCOUNTER — Ambulatory Visit (HOSPITAL_BASED_OUTPATIENT_CLINIC_OR_DEPARTMENT_OTHER): Payer: 59 | Admitting: Genetic Counselor

## 2018-10-07 ENCOUNTER — Encounter: Payer: Self-pay | Admitting: Genetic Counselor

## 2018-10-07 DIAGNOSIS — Z1509 Genetic susceptibility to other malignant neoplasm: Secondary | ICD-10-CM

## 2018-10-07 DIAGNOSIS — Z1379 Encounter for other screening for genetic and chromosomal anomalies: Secondary | ICD-10-CM

## 2018-10-07 DIAGNOSIS — Z1501 Genetic susceptibility to malignant neoplasm of breast: Secondary | ICD-10-CM

## 2018-10-07 NOTE — Progress Notes (Signed)
GENETIC TEST RESULTS   Patient Name: Rebekah Sanchez Patient Age: 43 y.o. Encounter Date: 10/07/2018  Referring Provider: [name], MD    Rebekah Sanchez was seen in the [place] clinic on October 07, 2018 due to a family history of cancer and concern regarding a hereditary predisposition to cancer in the family. Please refer to the prior Genetics clinic note for more information regarding Rebekah Sanchez's medical and family histories and our assessment at the time.   FAMILY HISTORY:  We obtained a detailed, 4-generation family history.  Significant diagnoses are listed below: Family History  Problem Relation Age of Onset   Colon cancer Mother 80   Ovarian cancer Maternal Grandmother 67       d. 45   Breast cancer Maternal Aunt 45   Ovarian cancer Cousin        mother's mat first cousin   Kidney failure Father    Bone cancer Paternal Grandmother 66   Ovarian cancer Other        MGMs sister   Stomach cancer Other        MGMs sister    The patient has a son and daughter who are cancer free.  She has two brothers and a sister who are cancer free.  Her father is deceased and her mother is living.  The patient's father died from kidney failure.  He was an only child.  His mother died from bone cancer in her knee at age 84.  His father died of natural causes.  The patient's mother had colon cancer at 52.  She had a brother and three sisters.  One sister had breast cancer at 85.  The maternal grandparents are deceased.  The grandmother had ovarian cancer at 16 and died at 73.  She has several sisters, one had stomach cancer and one had breast cancer.  One of these women had a daughter with breast cancer.  Ms. Cotrell is unaware of previous family history of genetic testing for hereditary cancer risks. Patient's maternal ancestors are of African/Spanish descent, and paternal ancestors are of African/Spanish and Native [place] descent. There is no reported Ashkenazi  Jewish ancestry. There is no known consanguinity.   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  There were no deleterious mutations in  ALK, APC, ATM, AXIN2,BAP1,  BARD1, BLM, BMPR1A, BRCA1, BRIP1, CASR, CDC73, CDH1, CDK4, CDKN1B, CDKN1C, CDKN2A (p14ARF), CDKN2A (p16INK4a), CEBPA, CHEK2, CTNNA1, DICER1, DIS3L2, EGFR (c.2369C>T, p.Thr790Met variant only), EPCAM (Deletion/duplication testing only), FH, FLCN, GATA2, GPC3, GREM1 (Promoter region deletion/duplication testing only), HOXB13 (c.251G>A, p.Gly84Glu), HRAS, KIT, MAX, MEN1, MET, MITF (c.952G>A, p.Glu318Lys variant only), MLH1, MSH2, MSH3, MSH6, MUTYH, NBN, NF1, NF2, NTHL1, PALB2, PDGFRA, PHOX2B, PMS2, POLD1, POLE, POT1, PRKAR1A, PTCH1, PTEN, RAD50, RAD51C, RAD51D, RB1, RECQL4, RET, RUNX1, SDHAF2, SDHA (sequence changes only), SDHB, SDHC, SDHD, SMAD4, SMARCA4, SMARCB1, SMARCE1, STK11, SUFU, TERC, TERT, TMEM127, TP53, TSC1, TSC2, VHL, WRN and WT1.  .   Clinical condition The average womans lifetime risk of developing breast cancer is 12%; her risk for developing ovarian cancer is 1.3% (SEER database 2014. https://seer.ShavedPoints.is. Accessed September 2017). Most cases of these cancers are sporadic and are not due to hereditary factors, but approximately 5%-10% of breast and ovarian cancer cases are hereditary and due to an identifiable pathogenic variant in a disease-causing gene. Hereditary breast and ovarian cancer syndrome (HBOC) due  to pathogenic variants in the BRCA1 and BRCA2 genes accounts for the majority of hereditary breast and ovarian cancer cases in individuals with a strong family history or an early-onset diagnosis.  HBOC syndrome is characterized by an increased lifetime risk for generally adult-onset cancers including, breast, contralateral breast, female breast, ovarian, prostate, and  pancreatic (PMID: 96283662).  The cancers associated with BRCA2 are:   Breast cancer, up to a 84% risk (PMID: 9476546 )  Ovarian cancer, up to a 27% risk (PMID: 5035465)  Pancreatic cancer, 2-7% (PMID: 68127517, 00174944, 96759163, 84665993)  Prostate cancer, elevated (57017793, 90300923)  Melanoma, elevated (PMID: 30076226, 33354562)  BRCA2 also has preliminary evidence for an association with hematologic malignancy (PMID: 56389373); therefore, this gene is available as a preliminary-evidence gene on the [facility] Myelodysplastic Syndrome/Leukemia Panel11. Preliminary-evidence genes are selected from an extensive review of the literature and expert recommendations, but the association between the gene and the specific condition has not been completely established. This uncertainty may be resolved as new information becomes available, and therefore clinicians may continue to order these preliminary-evidence genes.  Gene information The BRCA2 gene encodes a nuclear phosphoprotein that plays a role in the homologous recombination pathway for double-stranded DNA repair. As a tumor suppressor gene, loss of BRCA2 protein function leads to genomic instability and malignant transformation. If there is a pathogenic variant in this gene that prevents it from functioning normally, there may be an increased risk to develop certain cancers (NCBI Gene. Gene ID: [id number]. BuyingShow.es. Accessed September 2017).  Inheritance Hereditary predisposition to cancer due to pathogenic variants in the BRCA2 gene has autosomal dominant inheritance. This means that an individual with a pathogenic variant has a 50% chance of passing the condition on to his/her offspring. Most cases are inherited from a parent, but some cases may occur spontaneously (i.e., an individual with a pathogenic variant has parents who do not have it). Identification of a pathogenic variant allows for the recognition of  at-risk relatives who can pursue testing for the familial variant.  Individuals with a single pathogenic BRCA2 variant are also carriers of autosomal recessive Fanconi anemia. Fanconi anemia is characterized by bone marrow failure with variable additional anomalies, which often include short stature, abnormal skin pigmentation, abnormal thumbs, malformations of the skeletal and central nervous systems, and developmental delay (PMID: 7681157, 26203559). Risk of leukemia and early-onset solid tumors is significantly elevated with this disorder (PMID: 74163845, 36468032, 12248250). For there to be a risk of Fanconi anemia in offspring, both the patient and their partner would each have to carry a pathogenic variant in BRCA2; in this case, the risk to have an affected child is 25%.  Management Management guidelines for individuals with pathogenic BRCA2 variants have been developed by the [place] (NCCN):  NCCN cancer surveillance:  Females:   Breast awareness starting at age 67  Clinical breast exams every 6-12 months beginning at 41 years of age or at the age of the earliest diagnosed breast cancer in the family  Annual breast MRIs with contrast beginning between the ages of 106 and 5 (or annual mammograms with consideration of tomosynthesis if MRI is unavailable), although the age to initiate screening may be individualized based on family history  Annual breast MRI with contrast and annual mammography with consideration of tomosynthesis between the ages of 41 and 48  After age 55, management should be considered on an individual basis.  For women treated for breast cancer, screening of remaining breast tissue with annual mammography  and breast MRI should continue.  Consider risk-reducing mastectomy, and counsel regarding degree of protection, degree of cancer risk, and reconstruction options.  Address the psychosocial, social, and quality-of-life aspects of  undergoing risk-reducing mastectomy.  Recommend risk-reducing salpingo-oophorectomy, typically between age 44 and 60 years and upon the completion of childbearing, but it is reasonable to consider delaying until 40-45 years for BRCA2 carriers if patient has already maximized their breast cancer prevention (i.e., bilateral mastectomy). See specific Risk-Reducing Salpingo-Oophorectomy (RRSO) Protocol in NCCN Guidelines for Ovarian Cancer - Principles of Surgery.  Counseling includes a discussion of reproductive desires, extent of cancer risk, degree of protection for breast and ovarian cancer, management of menopausal symptoms, possible short-term hormonal replacement and medical issues.  Salpingectomy alone is not the standard of care for risk reduction although clinical trials of interval salpingectomy and delayed oophorectomy are ongoing. The concern for risk reducing salpingectomy alone is that women are still at risk for developing ovarian cancer. In addition, in pre-menopausal women, oophorectomy likely reduces the risk of developing breast cancer, but the magnitude is uncertain and may be gene specific.  For those patients who have not elected RRSO, transvaginal ultrasound combined with serum CA-125 for ovarian cancer screening has not been shown to be sufficient sensitive or specific as to support a positive recommendation, but, although of uncertain benefit, may be considered at the clinicians discretion starting at age 22-35 years.  Consider risk-reducing agents as options for breast and ovarian cancer.  Males:   Breast self-exam training and education starting at age 25 years  Clinical breast exam every 12 months, beginning at age 70 years  Consider prostate-cancer screening beginning at age 86 years.  Pancreatic cancer: NCCN cites insufficient evidence to warrant screening for pancreatic cancer (NCCN. Genetic/Familial High-Risk Assessment: Breast and Ovarian. Version 1.2018). In  contrast, the [employer] of Gastroenterology Clinical Guidelines recommend pancreatic cancer screening in BRCA2 carriers be limited to those with a first- or second-degree relative affected with pancreatic cancer. Ideally, screening should be performed in experienced centers utilizing a multidisciplinary approach under research conditions. Recommended screening includes annual endoscopic ultrasound and/or MRI of the pancreas starting at age 44 or 41 years younger than the earliest age of pancreatic cancer diagnosis in the family (PMID: 87867672).  Additional risk reduction and management considerations:   Chemoprevention can reduce the risk of breast cancer in the contralateral breast in women with BRCA1 and BRCA2 mutations who have been diagnosed with breast cancer (PMID: 0947096, 28366294).  Oral contraceptive use has been shown to reduce the risk of ovarian cancer by approximately 60% in BRCA mutation carriers if taken for at least 5 years (PMID: 7654650).  Recent studies have some preliminary data that suggest PARP inhibitors may be a beneficial chemotherapeutic agent for a subset of patients with BRCA2-associated breast and ovarian cancers. Clinical trials are currently in process to determine if and how this agent can be useful in the treatment of BRCA2 cancer patients (PMID: 35465681).  An individuals cancer risk and medical management are not determined by genetic test results alone. Overall cancer risk assessment incorporates additional factors including personal medical history, family history, as well as available genetic information that may result in a personalized plan for cancer prevention and surveillance.  MEDICAL MANAGEMENT: Women who have a BRCA mutation have an increased risk for both breast and ovarian cancer.   As discussed with Ms. Corsello, to reduce the risk for breast cancer, prophylactic bilateral mastectomy is the most effective option for risk reduction. However,  for  women who choose to keep their breasts intensified screening is equally safe.  We recommend yearly mammograms, yearly breast MRI, twice-yearly clinical breast exams, and monthly self-breast exams. Ms. Allan is referred for her mammograms by Dr. Pamala Hurry.  We will also refer Ms. Revoir to the [facility].    To reduce the risk for ovarian cancer, we recommend Ms. Versteeg have a prophylactic bilateral salpingo-oophorectomy when childbearing is completed, if planned. We discussed that screening with CA-125 blood tests and transvaginal ultrasounds can be done twice per year. However, these tests have not been shown to detect ovarian cancer at an early stage.  Ms. Whipple will follow up with Dr. Pamala Hurry in regards to her ovarian cancer risk.    RISK REDUCTION: There are several things that can be offered to individuals who are carriers for BRCA mutations that will reduce the risk for getting cancer.    The use of oral contraceptives can lower the risk for ovarian cancer, and, per case control studies, does not significantly increase the risk for breast cancer in BRCA patients.  Case control studies have shown that oral contraceptives can lower the risk for ovarian cancer in women with BRCA mutations. Additionally, a more recent meta-analysis, including one cohort (n=3,181) and one case control study (1,096 cases and 2,878 controls) also showed an inverse correlation between ovarian cancer and ever having used oral contraceptives (OR, 0.58; 95% CI = 0.46-0.73).  Studies on oral contraceptives and breast cancer have been conflicting, with some studies suggesting that there is not an increased risk for breast cancer in BRCA mutation carriers, while others suggest that there could be a risk.  That said, two meta-analysis studies have shown that there is not an increased risk for breast cancer with oral contraceptive use in BRCA1 and BRCA2 carriers.    In individuals who  have a prophylactic bilateral salpingo-oophorectomy (BSO), the risk for breast cancer is reduced by up to 50%.  It has been reported that short term hormone replacement therapy in women undergoing prophylactic BSO does not negate the reduction of breast cancer risk associated with surgery (1.2018 NCCN guidelines).  FAMILY MEMBERS: It is advantageous to know if a BRCA2 pathogenic variant is present as medical management recommendations can be implemented. At-risk relatives can be identified, allowing pursuit of a diagnostic evaluation. In addition, the available information regarding hereditary cancer susceptibility genes is constantly evolving and more clinically relevant BRCA2 data is likely to become available in the near future. Awareness of this cancer predisposition allows patients and their providers to be vigilant in maintaining close and regular contact with their local genetics clinic in anticipation of new information, inform at-risk family members, and diligently follow condition-specific screening protocols.  It is important that all of Ms. Gorrell's relatives (both men and women) know of the presence of this gene mutation. Site-specific genetic testing can sort out who in the family is at risk and who is not.   Ms. Topham children are have a 50% chance to have inherited this mutation. However, they are relatively young and this will not be of any consequence to them for several years. We do not test children because there is no risk to them until they are adults. We recommend they have genetic counseling and testing by the time they are in their early 20s.    Ms. Rolon siblings have a 50% chance to have inherited this mutation. We recommend they have genetic testing for this  same mutation, as identifying the presence of this mutation would allow them to also take advantage of risk-reducing measures.   Additionally, individuals with a pathogenic variant in BRCA2 are carriers of Fanconi  anemia Fanconi anemia is an autosomal recessive disorder that is characterized by bone marrow failure and variable presentation of anomalies, including short stature, abnormal skin pigmentation, abnormal thumbs, malformations of the skeletal and central nervous systems, and developmental delay. Risks for leukemia and early onset solid tumors are significantly elevated. For there to be a risk of Fanconi anemia in offspring, both parents would each have to have a single pathogenic variant in BRCA2; in such a case, the risk of having an affected child is 25%.  SUPPORT AND RESOURCES: If Ms. Grieshop is interested in BRCA-specific information and support, there are two groups, Facing Our Risk (www.facingourrisk.com) and Bright Pink (www.brightpink.org) which some people have found useful. They provide opportunities to speak with other individuals from high-risk families. To locate genetic counselors in other cities, visit the website of the [employer] (ArtistMovie.se) and Secretary/administrator for a Social worker by zip code.  We encouraged Ms. Calabretta to remain in contact with [place] on an annual basis so we can update her personal and family histories, and let her know of advances in cancer genetics that may benefit the family. Our contact number was provided. Ms. Goodell questions were answered to her satisfaction today, and she knows she is welcome to call anytime with additional questions.   Jonathin Heinicke P. Florene [place], [place], [facility] Licensed, Insurance risk surveyor [email or web address] phone: [phone]  The patient was seen for a total of 60 minutes in face-to-face genetic counseling.

## 2018-10-12 ENCOUNTER — Telehealth: Payer: Self-pay | Admitting: Genetic Counselor

## 2018-10-12 NOTE — Telephone Encounter (Signed)
VM is full and cannot accept messages.  I will send an email that we have her RNA test kit for [facility].  We can schedule an appointment for a blood draw.

## 2018-10-14 ENCOUNTER — Other Ambulatory Visit: Payer: Self-pay | Admitting: Genetic Counselor

## 2018-10-14 DIAGNOSIS — Z803 Family history of malignant neoplasm of breast: Secondary | ICD-10-CM

## 2018-10-19 ENCOUNTER — Inpatient Hospital Stay: Payer: 59 | Attending: Genetic Counselor

## 2018-10-19 ENCOUNTER — Other Ambulatory Visit: Payer: Self-pay

## 2018-10-19 DIAGNOSIS — Z803 Family history of malignant neoplasm of breast: Secondary | ICD-10-CM

## 2018-11-20 ENCOUNTER — Telehealth: Payer: Self-pay | Admitting: Genetic Counselor

## 2018-11-20 NOTE — Telephone Encounter (Signed)
Discussed follow up genetic testing results from [facility].  The patient originally had been tested through [facility] who identified two BRCA2 VUS.  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  In follow up with [facility] about this variant, they indicated they call this pathogenic due to evidence in their RNA lab.  [facility] did reclassify their report after finding additional literature suggesting this variant to be likely pathogenic.  They offered RNA testing to Ms. Leser through their RNA research laboratroy.  We discussed with Ms. Berens that we could also offer retesting through [facility].  She decided to do both.    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  They also identified the other BRCA2 variant that had been classified as a VUS, but [facility] calls it benign.  This was reported to Ms. Obremski, and a copy of the [facility] report has also been provided.   

## 2018-11-24 ENCOUNTER — Telehealth: Payer: Self-pay | Admitting: Hematology and Oncology

## 2018-11-24 NOTE — Telephone Encounter (Signed)
A new high risk appt has been scheduled for Rebekah Sanchez to see Dr. Lindi Adie on 9/30 at 1pm.

## 2018-12-02 ENCOUNTER — Other Ambulatory Visit: Payer: Self-pay | Admitting: Obstetrics

## 2018-12-08 NOTE — Progress Notes (Signed)
[facility] CONSULT NOTE  Patient Care Team: Rankins, Rebekah Salinas, Sanchez as PCP - General (Family Medicine) Rebekah Gell, Sanchez as Consulting Physician (Obstetrics and Gynecology)  CHIEF COMPLAINTS/PURPOSE OF CONSULTATION:  Newly diagnosed high risk for breast cancer  HISTORY OF PRESENTING ILLNESS:  Rebekah Sanchez 43 y.o. female is here because of recent diagnosis of high risk for breast cancer. She has a family history of colon and ovarian cancer. She underwent genetic testing on 09/21/18 which showed she was positive for BRCA2 mutation. She presents to the clinic today for initial evaluation and discussion of surveillance options.  She originally had genetic testing which showed a BRCA2 VUS.  This was reclassified as pathogenic mutation.  She has seen her gynecologist who plans to do bilateral salpingo-oophorectomy and a hysterectomy on October 9. She was sent to [place] to discuss surveillance options for breast cancer.  I reviewed her records extensively and collaborated the history with the patient.  MEDICAL HISTORY:  Past Medical History:  Diagnosis Date  . Anemia   . Family history of breast cancer   . Family history of ovarian cancer   . Family history of stomach cancer   . Family hx of colon cancer    Mother  . History of Helicobacter pylori infection    WHEN SHE WAS 18    SURGICAL HISTORY: No past surgical history on file.  SOCIAL HISTORY: Social History   Socioeconomic History  . Marital status: Married    Spouse name: Not on file  . Number of children: Not on file  . Years of education: Not on file  . Highest education level: Not on file  Occupational History  . Not on file  Social Needs  . Financial resource strain: Not on file  . Food insecurity    Worry: Not on file    Inability: Not on file  . Transportation needs    Medical: Not on file    Non-medical: Not on file  Tobacco Use  . Smoking status: Never Smoker  . Smokeless tobacco: Never  Used  Substance and Sexual Activity  . Alcohol use: No  . Drug use: No  . Sexual activity: Not on file  Lifestyle  . Physical activity    Days per week: Not on file    Minutes per session: Not on file  . Stress: Not on file  Relationships  . Social Herbalist on phone: Not on file    Gets together: Not on file    Attends religious service: Not on file    Active member of club or organization: Not on file    Attends meetings of clubs or organizations: Not on file    Relationship status: Not on file  . Intimate partner violence    Fear of current or ex partner: Not on file    Emotionally abused: Not on file    Physically abused: Not on file    Forced sexual activity: Not on file  Other Topics Concern  . Not on file  Social History Narrative  . Not on file    FAMILY HISTORY: Family History  Problem Relation Age of Onset  . Colon cancer Mother 46  . Ovarian cancer Maternal Grandmother 41       d. 79  . Breast cancer Maternal Aunt 21  . Ovarian cancer Cousin        mother's mat first cousin  . Kidney failure Father   . Bone cancer Paternal  Grandmother 16  . Ovarian cancer Other        MGMs sister  . Stomach cancer Other        MGMs sister    ALLERGIES:  is allergic to hyoscyamine sulfate.  MEDICATIONS:  Current Outpatient Medications  Medication Sig Dispense Refill  . B Complex-C (B-COMPLEX WITH VITAMIN C) tablet Take 1 tablet by mouth daily.    . calcium carbonate (TUMS EX) 750 MG chewable tablet Chew 1 tablet by mouth 2 (two) times daily.    . Cholecalciferol (VITAMIN D) 50 MCG (2000 UT) CAPS Take 4,000 Units by mouth daily.    Marland Kitchen FERREX 150 150 MG capsule Take 1 capsule (150 mg total) by mouth 2 (two) times daily. (Patient taking differently: Take 150 mg by mouth daily. ) 60 capsule 0  . ibuprofen (ADVIL) 200 MG tablet Take 400 mg by mouth every 6 (six) hours as needed for headache or mild pain.    . vitamin E 400 UNIT capsule Take 400 Units by mouth  daily.    . Zinc 15 MG CAPS Take 15 mg by mouth daily.     No current facility-administered medications for this visit.     REVIEW OF SYSTEMS:   Constitutional: Denies fevers, chills or abnormal night sweats Eyes: Denies blurriness of vision, double vision or watery eyes Ears, nose, mouth, throat, and face: Denies mucositis or sore throat Respiratory: Denies cough, dyspnea or wheezes Cardiovascular: Denies palpitation, chest discomfort or lower extremity swelling Gastrointestinal:  Denies nausea, heartburn or change in bowel habits Skin: Denies abnormal skin rashes Lymphatics: Denies new lymphadenopathy or easy bruising Neurological:Denies numbness, tingling or new weaknesses Behavioral/Psych: Mood is stable, no new changes  Breast: Denies any palpable lumps or discharge All other systems were reviewed with the patient and are negative.  PHYSICAL EXAMINATION: ECOG PERFORMANCE STATUS: 0 - Asymptomatic  Vitals:   12/09/18 1317  BP: 112/70  Pulse: 87  Resp: 20  Temp: 97.8 F (36.6 C)  SpO2: 100%   Filed Weights   12/09/18 1317  Weight: 126 lb 9.6 oz (57.4 kg)    GENERAL:alert, no distress and comfortable SKIN: skin color, texture, turgor are normal, no rashes or significant lesions EYES: normal, conjunctiva are pink and non-injected, sclera clear OROPHARYNX:no exudate, no erythema and lips, buccal mucosa, and tongue normal  NECK: supple, thyroid normal size, non-tender, without nodularity LYMPH:  no palpable lymphadenopathy in the cervical, axillary or inguinal LUNGS: clear to auscultation and percussion with normal breathing effort HEART: regular rate & rhythm and no murmurs and no lower extremity edema ABDOMEN:abdomen soft, non-tender and normal bowel sounds Musculoskeletal:no cyanosis of digits and no clubbing  PSYCH: alert & oriented x 3 with fluent speech NEURO: no focal motor/sensory deficits    LABORATORY DATA:  I have reviewed the data as listed Lab Results   Component Value Date   WBC 7.7 05/15/2018   HGB 12.5 05/15/2018   HCT 39.4 05/15/2018   MCV 91.8 05/15/2018   PLT 350 05/15/2018   Lab Results  Component Value Date   NA 140 02/10/2013   K 4.0 02/10/2013   CL 106 02/10/2013   CO2 26 02/10/2013    RADIOGRAPHIC STUDIES: I have personally reviewed the radiological reports and agreed with the findings in the report.  ASSESSMENT AND PLAN:  BRCA2 positive BRCA2 gene  mutation: I discussed with the patient that BRCA2 is a tumor suppressor gene which helps repair damaged DNA or destroy cells of the VNA cannot be  repaired. In patients with BRCA1 or 2 mutations, the damaged DNA could not be repaired properly increasing the risk of cancers. BRCA2 gene is located on long arm of chromosome 13. These mutations are inherited in autosomal dominant fashion and hence 50% probability that their children may have a BRCA mutation.  Cancer risk:  Average to risk of cancer by age of 32: (Antoniou et al pooled pedigree data from 14 studies of 8139 index patients with breast or ovarian cancer) Breast cancer risk: 45 percent (95% CI, 33 to 54 percent) Ovarian cancer risk: 11 percent (95% CI, 4.1 to 18 percent)  [place] study: Tumor limited to risk by age of 66: Breast cancer risk: 50 percent (95% CI, 41 to 70 percent) Ovarian cancer risk: 16.5 percent (95% CI, 7.5 to 34 percent)  I discussed the difference between BRCA1 and BRCA2 wherein patients BRCA1 and 2 have early onset disease and much higher risk of breast cancer. The mean age of diagnosis of breast cancer between BRCA1 and 2 are 75 versus 84 years, risk of ovarian cancer is also higher with BRCA1 but the overall risk under the age of 21 is very low.  Other cancer risks:  1. Pancreatic cancer (relative risk is 3.51) with incidence of 4.9% in BRCA2 carriers: Will obtain CT abdomen and pelvis with contrast every 2 years.  She has chronic mild abdominal discomfort. 2. Fallopian tube carcinoma and primary  peritoneal carcinoma 3. Uterine papillary serous carcinoma: Overall risk is very low 4. Colorectal cancers: In many studies there was no increased risk But there may be some for BRCA1 carriers 5. Melanoma and other skin cancers: The risk of oatmeal melanoma is increase in BRCA2 mutation carriers but it still extremely rare. 6. Endometrial cancer: Not very clear in terms of risks it is thought to be very low  Breast cancer risk reduction/surveillance: 1. Annual mammogram and breast MRI are recommended by NCCN.  We will set her up for a mammogram in the next week and breast MRI to be done in 6 months. 2. prophylactic bilateral mastectomy is an option.  She wants to do this but after she recovers from hysterectomy. 3. Oophorectomy self reduces the risk of breast cancer by 50%: She is undergoing this on October 9.    Ovarian cancer risk reduction: Since she will undergo bilateral salpingo-oophorectomy and hysterectomy, there is no further issues.    All questions were answered. The patient knows to call the clinic with any problems, questions or concerns.   Rulon Eisenmenger, Sanchez 12/09/2018    I, Molly Dorshimer, am acting as scribe for Nicholas Lose, Sanchez.  I have reviewed the above documentation for accuracy and completeness, and I agree with the above.

## 2018-12-09 ENCOUNTER — Other Ambulatory Visit: Payer: Self-pay

## 2018-12-09 ENCOUNTER — Inpatient Hospital Stay: Payer: 59 | Attending: Genetic Counselor | Admitting: Hematology and Oncology

## 2018-12-09 DIAGNOSIS — R634 Abnormal weight loss: Secondary | ICD-10-CM

## 2018-12-09 DIAGNOSIS — Z1231 Encounter for screening mammogram for malignant neoplasm of breast: Secondary | ICD-10-CM | POA: Diagnosis not present

## 2018-12-09 DIAGNOSIS — Z1501 Genetic susceptibility to malignant neoplasm of breast: Secondary | ICD-10-CM | POA: Insufficient documentation

## 2018-12-09 DIAGNOSIS — Z1509 Genetic susceptibility to other malignant neoplasm: Secondary | ICD-10-CM | POA: Diagnosis not present

## 2018-12-09 NOTE — Assessment & Plan Note (Signed)
BRCA2 gene  mutation: I discussed with the patient that BRCA2 is a tumor suppressor gene which helps repair damaged DNA or destroy cells of the VNA cannot be repaired. In patients with BRCA1 or 2 mutations, the damaged DNA could not be repaired properly increasing the risk of cancers. BRCA2 gene is located on long arm of chromosome 13. These mutations are inherited in autosomal dominant fashion and hence 50% probability that their children may have a BRCA mutation.  Cancer risk:  Average to risk of cancer by age of 47: (Antoniou et al pooled pedigree data from 92 studies of 8139 index patients with breast or ovarian cancer) Breast cancer risk: 45 percent (95% CI, 33 to 54 percent) Ovarian cancer risk: 11 percent (95% CI, 4.1 to 18 percent)  [place] study: Tumor limited to risk by age of 44: Breast cancer risk: 82 percent (95% CI, 41 to 70 percent) Ovarian cancer risk: 16.5 percent (95% CI, 7.5 to 34 percent)  I discussed the difference between BRCA1 and BRCA2 wherein patients BRCA1 and 2 have early onset disease and much higher risk of breast cancer. The mean age of diagnosis of breast cancer between BRCA1 and 2 are 30 versus 72 years, risk of ovarian cancer is also higher with BRCA1 but the overall risk under the age of 39 is very low.  Other cancer risks:  1. Pancreatic cancer (relative risk is 3.51) with incidence of 4.9% in BRCA2 carriers 2. Fallopian tube carcinoma and primary peritoneal carcinoma 3. Uterine papillary serous carcinoma: Overall risk is very low 4. Colorectal cancers: In many studies there was no increased risk But there may be some for BRCA1 carriers 5. Melanoma and other skin cancers: The risk of oatmeal melanoma is increase in BRCA2 mutation carriers but it still extremely rare. 6. Endometrial cancer: Not very clear in terms of risks it is thought to be very low  Breast cancer risk reduction/surveillance: 1. Annual mammogram and breast MRI are recommended by NCCN starting at  age of 4-30 2. Chemoprevention with tamoxifen-like agents is not entirely clear. Based on NSABP P1 study in the small cohort of patients with BRCA1 mutations, tamoxifen to reduce breast cancer risk by 62% and BRCA2 carriers but not in BRCA1 carriers. The study is limited because of small numbers. I do not recommend it primarily because most commonly patients with BRCA mutations tend to have triple negative disease. 3. After childbearing, evaluation for prophylactic bilateral mastectomy is an option. 4. Oophorectomy self reduces the risk of breast cancer by 50% 5. Breast self-examinations starting at age 75   Ovarian cancer risk reduction: 1. Risk reducing bilateral salpingo-oophorectomy between the ages of 15-40 once childbearing is complete is recommended. In one study that was 72% reduction of risk of ovarian cancer and a decrease in breast cancer by approximately 50% 2. There is no clear data for surveillance with CA125 or vaginal ultrasounds 2. Oral contraception dose may be protective against ovarian cancer but it may slightly increase risk of breast cancer.

## 2018-12-09 NOTE — Patient Instructions (Signed)
Rebekah Sanchez  12/09/2018     Your procedure is scheduled on 12-18-18    Report to [place]  at  11:00 A.M.   Call this number if you have problems the morning of surgery:[phone]   OUR ADDRESS IS [address], WE ARE LOCATED IN THE [facility] WITH [facility].   Remember: AFTER MIDNIGHT THE NIGHT PRIOR TO SURGERY. NOTHING BY MOUTH EXCEPT CLEAR LIQUIDS UNTIL 7:00 AM. AFTER 7:00 AM, NOTHING UNTIL AFTER SURGERY   CLEAR LIQUID DIET   Foods Allowed                                                                     Foods Excluded  Coffee and tea, regular and decaf                             liquids that you cannot  Plain Jell-O any favor except red or purple                                           see through such as: Fruit ices (not with fruit pulp)                                     milk, soups, orange juice  Iced Popsicles                                    All solid food Carbonated beverages, regular and diet                                    Cranberry, grape and apple juices Sports drinks like Gatorade Lightly seasoned clear broth or consume(fat free) Sugar, honey syrup   _____________________________________________________________________      Take these medicines the morning of surgery with A SIP OF WATER: None   Do not wear jewelry, make-up or nail polish.   Do not wear lotions, powders, or perfumes, or deoderant.   Do not shave 48 hours prior to surgery.    Do not bring valuables to the hospital.  [facility] is not responsible for any belongings or valuables.  Contacts, dentures or bridgework may not be worn into surgery.  Leave your suitcase in the car.  After surgery it may be brought to your room.  For patients admitted to the hospital, discharge time will be determined by your treatment team.  Patients discharged the day of surgery will not be allowed to drive home.   Special instructions: Please bring  your medication in their original pill bottles  Please read over the following fact sheets that you were given:       [facility] - Preparing for Surgery Before surgery, you can play an important role.  Because skin is not sterile, your skin needs to be as free of germs  as possible.  You can reduce the number of germs on your skin by washing with CHG (chlorahexidine gluconate) soap before surgery.  CHG is an antiseptic cleaner which kills germs and bonds with the skin to continue killing germs even after washing. Please DO NOT use if you have an allergy to CHG or antibacterial soaps.  If your skin becomes reddened/irritated stop using the CHG and inform your nurse when you arrive at Short Stay. Do not shave (including legs and underarms) for at least 48 hours prior to the first CHG shower.  You may shave your face/neck. Please follow these instructions carefully:  1.  Shower with CHG Soap the night before surgery and the  morning of Surgery.  2.  If you choose to wash your hair, wash your hair first as usual with your  normal  shampoo.  3.  After you shampoo, rinse your hair and body thoroughly to remove the  shampoo.                           4.  Use CHG as you would any other liquid soap.  You can apply chg directly  to the skin and wash                       Gently with a scrungie or clean washcloth.  5.  Apply the CHG Soap to your body ONLY FROM THE NECK DOWN.   Do not use on face/ open                           Wound or open sores. Avoid contact with eyes, ears mouth and genitals (private parts).                       Wash face,  Genitals (private parts) with your normal soap.             6.  Wash thoroughly, paying special attention to the area where your surgery  will be performed.  7.  Thoroughly rinse your body with warm water from the neck down.  8.  DO NOT shower/wash with your normal soap after using and rinsing off  the CHG Soap.                9.  Pat yourself dry with a clean  towel.            10.  Wear clean pajamas.            11.  Place clean sheets on your bed the night of your first shower and do not  sleep with pets. Day of Surgery : Do not apply any lotions/deodorants the morning of surgery.  Please wear clean clothes to the hospital/surgery center.  FAILURE TO FOLLOW THESE INSTRUCTIONS MAY RESULT IN THE CANCELLATION OF YOUR SURGERY PATIENT SIGNATURE_________________________________  NURSE SIGNATURE__________________________________  ________________________________________________________________________  WHAT IS A BLOOD TRANSFUSION? Blood Transfusion Information  A transfusion is the replacement of blood or some of its parts. Blood is made up of multiple cells which provide different functions.  Red blood cells carry oxygen and are used for blood loss replacement.  White blood cells fight against infection.  Platelets control bleeding.  Plasma helps clot blood.  Other blood products are available for specialized needs, such as hemophilia or other clotting disorders. BEFORE THE TRANSFUSION  Who gives blood for transfusions?  Healthy volunteers who are fully evaluated to make sure their blood is safe. This is blood bank blood. Transfusion therapy is the safest it has ever been in the practice of medicine. Before blood is taken from a donor, a complete history is taken to make sure that person has no history of diseases nor engages in risky social behavior (examples are intravenous drug use or sexual activity with multiple partners). The donor's travel history is screened to minimize risk of transmitting infections, such as malaria. The donated blood is tested for signs of infectious diseases, such as HIV and hepatitis. The blood is then tested to be sure it is compatible with you in order to minimize the chance of a transfusion reaction. If you or a relative donates blood, this is often done in anticipation of surgery and is not appropriate for  emergency situations. It takes many days to process the donated blood. RISKS AND COMPLICATIONS Although transfusion therapy is very safe and saves many lives, the main dangers of transfusion include:   Getting an infectious disease.  Developing a transfusion reaction. This is an allergic reaction to something in the blood you were given. Every precaution is taken to prevent this. The decision to have a blood transfusion has been considered carefully by your caregiver before blood is given. Blood is not given unless the benefits outweigh the risks. AFTER THE TRANSFUSION  Right after receiving a blood transfusion, you will usually feel much better and more energetic. This is especially true if your red blood cells have gotten low (anemic). The transfusion raises the level of the red blood cells which carry oxygen, and this usually causes an energy increase.  The nurse administering the transfusion will monitor you carefully for complications. HOME CARE INSTRUCTIONS  No special instructions are needed after a transfusion. You may find your energy is better. Speak with your caregiver about any limitations on activity for underlying diseases you may have. SEEK MEDICAL CARE IF:   Your condition is not improving after your transfusion.  You develop redness or irritation at the intravenous (IV) site. SEEK IMMEDIATE MEDICAL CARE IF:  Any of the following symptoms occur over the next 12 hours:  Shaking chills.  You have a temperature by mouth above 102 F (38.9 C), not controlled by medicine.  Chest, back, or muscle pain.  People around you feel you are not acting correctly or are confused.  Shortness of breath or difficulty breathing.  Dizziness and fainting.  You get a rash or develop hives.  You have a decrease in urine output.  Your urine turns a dark color or changes to pink, red, or brown. Any of the following symptoms occur over the next 10 days:  You have a temperature by  mouth above 102 F (38.9 C), not controlled by medicine.  Shortness of breath.  Weakness after normal activity.  The white part of the eye turns yellow (jaundice).  You have a decrease in the amount of urine or are urinating less often.  Your urine turns a dark color or changes to pink, red, or brown. Document Released: 02/23/2000 Document Revised: 05/20/2011 Document Reviewed: 10/12/2007 [facility] Patient Information 2014 [place], [place].  _______________________________________________________________________

## 2018-12-10 ENCOUNTER — Telehealth: Payer: Self-pay | Admitting: Hematology and Oncology

## 2018-12-10 ENCOUNTER — Encounter (HOSPITAL_COMMUNITY): Payer: Self-pay

## 2018-12-10 ENCOUNTER — Other Ambulatory Visit: Payer: Self-pay

## 2018-12-10 ENCOUNTER — Encounter (HOSPITAL_COMMUNITY)
Admission: RE | Admit: 2018-12-10 | Discharge: 2018-12-10 | Disposition: A | Discharge status: Home / Self Care | Payer: 59 | Source: Ambulatory Visit | Attending: Obstetrics | Admitting: Obstetrics

## 2018-12-10 DIAGNOSIS — Z01812 Encounter for preprocedural laboratory examination: Secondary | ICD-10-CM | POA: Insufficient documentation

## 2018-12-10 DIAGNOSIS — Z1501 Genetic susceptibility to malignant neoplasm of breast: Secondary | ICD-10-CM | POA: Insufficient documentation

## 2018-12-10 LAB — CBC
HCT: 37.7 % (ref 36.0–46.0)
Hemoglobin: 11.9 g/dL — ABNORMAL LOW (ref 12.0–15.0)
MCH: 28.8 pg (ref 26.0–34.0)
MCHC: 31.6 g/dL (ref 30.0–36.0)
MCV: 91.3 fL (ref 80.0–100.0)
Platelets: 397 10*3/uL (ref 150–400)
RBC: 4.13 MIL/uL (ref 3.87–5.11)
RDW: 13.1 % (ref 11.5–15.5)
WBC: 12.7 10*3/uL — ABNORMAL HIGH (ref 4.0–10.5)
nRBC: 0 % (ref 0.0–0.2)

## 2018-12-10 NOTE — Telephone Encounter (Signed)
I talk with patient regarding schedule  

## 2018-12-11 LAB — ABO/RH: ABO/RH(D): O POS

## 2018-12-14 ENCOUNTER — Other Ambulatory Visit: Payer: Self-pay | Admitting: Obstetrics

## 2018-12-15 ENCOUNTER — Other Ambulatory Visit (HOSPITAL_COMMUNITY)
Admission: RE | Admit: 2018-12-15 | Discharge: 2018-12-15 | Disposition: A | Discharge status: Home / Self Care | Payer: 59 | Source: Ambulatory Visit | Attending: Obstetrics | Admitting: Obstetrics

## 2018-12-15 DIAGNOSIS — Z20828 Contact with and (suspected) exposure to other viral communicable diseases: Secondary | ICD-10-CM | POA: Diagnosis not present

## 2018-12-15 DIAGNOSIS — Z01812 Encounter for preprocedural laboratory examination: Secondary | ICD-10-CM | POA: Insufficient documentation

## 2018-12-16 LAB — NOVEL CORONAVIRUS, NAA (HOSP ORDER, SEND-OUT TO REF LAB; TAT 18-24 HRS): SARS-CoV-2, NAA: NOT DETECTED

## 2018-12-18 ENCOUNTER — Encounter (HOSPITAL_BASED_OUTPATIENT_CLINIC_OR_DEPARTMENT_OTHER)
Admission: RE | Disposition: A | Discharge status: Home / Self Care | Payer: Self-pay | Source: Other Acute Inpatient Hospital | Attending: Obstetrics

## 2018-12-18 ENCOUNTER — Other Ambulatory Visit: Payer: Self-pay

## 2018-12-18 ENCOUNTER — Ambulatory Visit (HOSPITAL_BASED_OUTPATIENT_CLINIC_OR_DEPARTMENT_OTHER)
Admission: RE | Admit: 2018-12-18 | Discharge: 2018-12-19 | Disposition: A | Discharge status: Home / Self Care | Payer: 59 | Source: Other Acute Inpatient Hospital | Attending: Obstetrics | Admitting: Obstetrics

## 2018-12-18 ENCOUNTER — Ambulatory Visit (HOSPITAL_BASED_OUTPATIENT_CLINIC_OR_DEPARTMENT_OTHER): Payer: 59 | Admitting: Anesthesiology

## 2018-12-18 ENCOUNTER — Encounter (HOSPITAL_BASED_OUTPATIENT_CLINIC_OR_DEPARTMENT_OTHER): Payer: Self-pay

## 2018-12-18 ENCOUNTER — Ambulatory Visit (HOSPITAL_BASED_OUTPATIENT_CLINIC_OR_DEPARTMENT_OTHER): Payer: 59 | Admitting: Physician Assistant

## 2018-12-18 DIAGNOSIS — Z1501 Genetic susceptibility to malignant neoplasm of breast: Secondary | ICD-10-CM | POA: Diagnosis not present

## 2018-12-18 DIAGNOSIS — Z800 Family history of malignant neoplasm of digestive organs: Secondary | ICD-10-CM | POA: Diagnosis not present

## 2018-12-18 DIAGNOSIS — N800 Endometriosis of uterus: Secondary | ICD-10-CM | POA: Diagnosis not present

## 2018-12-18 DIAGNOSIS — Z90721 Acquired absence of ovaries, unilateral: Secondary | ICD-10-CM | POA: Diagnosis present

## 2018-12-18 DIAGNOSIS — Z8041 Family history of malignant neoplasm of ovary: Secondary | ICD-10-CM | POA: Insufficient documentation

## 2018-12-18 DIAGNOSIS — D649 Anemia, unspecified: Secondary | ICD-10-CM | POA: Insufficient documentation

## 2018-12-18 DIAGNOSIS — N72 Inflammatory disease of cervix uteri: Secondary | ICD-10-CM | POA: Insufficient documentation

## 2018-12-18 DIAGNOSIS — K219 Gastro-esophageal reflux disease without esophagitis: Secondary | ICD-10-CM | POA: Insufficient documentation

## 2018-12-18 DIAGNOSIS — N8311 Corpus luteum cyst of right ovary: Secondary | ICD-10-CM | POA: Insufficient documentation

## 2018-12-18 DIAGNOSIS — Z803 Family history of malignant neoplasm of breast: Secondary | ICD-10-CM | POA: Diagnosis not present

## 2018-12-18 DIAGNOSIS — N8312 Corpus luteum cyst of left ovary: Secondary | ICD-10-CM | POA: Diagnosis not present

## 2018-12-18 DIAGNOSIS — Z90710 Acquired absence of both cervix and uterus: Secondary | ICD-10-CM | POA: Diagnosis present

## 2018-12-18 DIAGNOSIS — N83291 Other ovarian cyst, right side: Secondary | ICD-10-CM | POA: Insufficient documentation

## 2018-12-18 DIAGNOSIS — Z8619 Personal history of other infectious and parasitic diseases: Secondary | ICD-10-CM | POA: Diagnosis not present

## 2018-12-18 DIAGNOSIS — N83292 Other ovarian cyst, left side: Secondary | ICD-10-CM | POA: Insufficient documentation

## 2018-12-18 HISTORY — PX: ROBOTIC ASSISTED TOTAL HYSTERECTOMY WITH BILATERAL SALPINGO OOPHERECTOMY: SHX6086

## 2018-12-18 LAB — TYPE AND SCREEN
ABO/RH(D): O POS
Antibody Screen: NEGATIVE

## 2018-12-18 LAB — POCT PREGNANCY, URINE: Preg Test, Ur: NEGATIVE

## 2018-12-18 SURGERY — HYSTERECTOMY, TOTAL, ROBOT-ASSISTED, LAPAROSCOPIC, WITH BILATERAL SALPINGO-OOPHORECTOMY
Anesthesia: General | Site: Abdomen | Laterality: Bilateral

## 2018-12-18 MED ORDER — GLYCOPYRROLATE PF 0.2 MG/ML IJ SOSY
PREFILLED_SYRINGE | INTRAMUSCULAR | Status: AC
  Filled 2018-12-18: qty 1

## 2018-12-18 MED ORDER — SUGAMMADEX SODIUM 200 MG/2ML IV SOLN
INTRAVENOUS | Status: DC | PRN
  Administered 2018-12-18: 111.6 mg via INTRAVENOUS

## 2018-12-18 MED ORDER — ROCURONIUM BROMIDE 10 MG/ML (PF) SYRINGE
PREFILLED_SYRINGE | INTRAVENOUS | Status: AC
  Filled 2018-12-18: qty 10

## 2018-12-18 MED ORDER — LACTATED RINGERS IV SOLN
INTRAVENOUS | Status: DC
  Administered 2018-12-18: 15:00:00 via INTRAVENOUS
  Administered 2018-12-18: 12:00:00 50 mL/h via INTRAVENOUS
  Filled 2018-12-18: qty 1000

## 2018-12-18 MED ORDER — FENTANYL CITRATE (PF) 100 MCG/2ML IJ SOLN
25.0000 ug | INTRAMUSCULAR | Status: DC | PRN
  Administered 2018-12-18 (×2): 50 ug via INTRAVENOUS
  Filled 2018-12-18: qty 1

## 2018-12-18 MED ORDER — KETOROLAC TROMETHAMINE 30 MG/ML IJ SOLN
INTRAMUSCULAR | Status: DC | PRN
  Administered 2018-12-18: 30 mg via INTRAVENOUS

## 2018-12-18 MED ORDER — ONDANSETRON HCL 4 MG/2ML IJ SOLN
INTRAMUSCULAR | Status: AC
  Filled 2018-12-18: qty 2

## 2018-12-18 MED ORDER — CEFAZOLIN SODIUM-DEXTROSE 2-4 GM/100ML-% IV SOLN
INTRAVENOUS | Status: AC
  Filled 2018-12-18: qty 100

## 2018-12-18 MED ORDER — ONDANSETRON HCL 4 MG/2ML IJ SOLN
INTRAMUSCULAR | Status: DC | PRN
  Administered 2018-12-18: 4 mg via INTRAVENOUS

## 2018-12-18 MED ORDER — EPHEDRINE SULFATE 50 MG/ML IJ SOLN
INTRAMUSCULAR | Status: DC | PRN
  Administered 2018-12-18 (×2): 5 mg via INTRAVENOUS

## 2018-12-18 MED ORDER — DEXAMETHASONE SODIUM PHOSPHATE 10 MG/ML IJ SOLN
INTRAMUSCULAR | Status: AC
  Filled 2018-12-18: qty 1

## 2018-12-18 MED ORDER — DEXAMETHASONE SODIUM PHOSPHATE 4 MG/ML IJ SOLN
INTRAMUSCULAR | Status: DC | PRN
  Administered 2018-12-18: 10 mg via INTRAVENOUS

## 2018-12-18 MED ORDER — SCOPOLAMINE 1 MG/3DAYS TD PT72
MEDICATED_PATCH | TRANSDERMAL | Status: AC
  Filled 2018-12-18: qty 1

## 2018-12-18 MED ORDER — IBUPROFEN 200 MG PO TABS
ORAL_TABLET | ORAL | Status: AC
  Filled 2018-12-18: qty 4

## 2018-12-18 MED ORDER — EPHEDRINE 5 MG/ML INJ
INTRAVENOUS | Status: AC
  Filled 2018-12-18: qty 10

## 2018-12-18 MED ORDER — OXYCODONE-ACETAMINOPHEN 5-325 MG PO TABS
ORAL_TABLET | ORAL | Status: AC
  Filled 2018-12-18: qty 1

## 2018-12-18 MED ORDER — SCOPOLAMINE 1 MG/3DAYS TD PT72
1.0000 | MEDICATED_PATCH | TRANSDERMAL | Status: DC
  Administered 2018-12-18: 1.5 mg via TRANSDERMAL
  Filled 2018-12-18: qty 1

## 2018-12-18 MED ORDER — MIDAZOLAM HCL 2 MG/2ML IJ SOLN
INTRAMUSCULAR | Status: DC | PRN
  Administered 2018-12-18: 2 mg via INTRAVENOUS

## 2018-12-18 MED ORDER — FENTANYL CITRATE (PF) 100 MCG/2ML IJ SOLN
INTRAMUSCULAR | Status: DC | PRN
  Administered 2018-12-18 (×2): 25 ug via INTRAVENOUS
  Administered 2018-12-18: 50 ug via INTRAVENOUS
  Administered 2018-12-18: 100 ug via INTRAVENOUS
  Administered 2018-12-18: 50 ug via INTRAVENOUS

## 2018-12-18 MED ORDER — SIMETHICONE 80 MG PO CHEW
80.0000 mg | CHEWABLE_TABLET | Freq: Four times a day (QID) | ORAL | Status: DC | PRN
  Filled 2018-12-18: qty 1

## 2018-12-18 MED ORDER — ONDANSETRON HCL 4 MG PO TABS
4.0000 mg | ORAL_TABLET | Freq: Four times a day (QID) | ORAL | Status: DC | PRN
  Filled 2018-12-18: qty 1

## 2018-12-18 MED ORDER — BUTORPHANOL TARTRATE 1 MG/ML IJ SOLN
1.0000 mg | INTRAMUSCULAR | Status: DC | PRN
  Filled 2018-12-18: qty 2

## 2018-12-18 MED ORDER — IBUPROFEN 800 MG PO TABS
800.0000 mg | ORAL_TABLET | Freq: Three times a day (TID) | ORAL | Status: DC
  Administered 2018-12-18 – 2018-12-19 (×2): 800 mg via ORAL
  Filled 2018-12-18: qty 1

## 2018-12-18 MED ORDER — MIDAZOLAM HCL 2 MG/2ML IJ SOLN
INTRAMUSCULAR | Status: AC
  Filled 2018-12-18: qty 2

## 2018-12-18 MED ORDER — BUPIVACAINE HCL (PF) 0.25 % IJ SOLN
INTRAMUSCULAR | Status: DC | PRN
  Administered 2018-12-18: 15 mL

## 2018-12-18 MED ORDER — CEFAZOLIN SODIUM-DEXTROSE 2-4 GM/100ML-% IV SOLN
2.0000 g | INTRAVENOUS | Status: AC
  Administered 2018-12-18: 2 g via INTRAVENOUS
  Filled 2018-12-18: qty 100

## 2018-12-18 MED ORDER — ONDANSETRON HCL 4 MG/2ML IJ SOLN
4.0000 mg | Freq: Four times a day (QID) | INTRAMUSCULAR | Status: DC | PRN
  Administered 2018-12-19: 4 mg via INTRAVENOUS
  Filled 2018-12-18: qty 2

## 2018-12-18 MED ORDER — ACETAMINOPHEN 500 MG PO TABS
1000.0000 mg | ORAL_TABLET | Freq: Once | ORAL | Status: AC
  Administered 2018-12-18: 1000 mg via ORAL
  Filled 2018-12-18: qty 2

## 2018-12-18 MED ORDER — FENTANYL CITRATE (PF) 250 MCG/5ML IJ SOLN
INTRAMUSCULAR | Status: AC
  Filled 2018-12-18: qty 5

## 2018-12-18 MED ORDER — PANTOPRAZOLE SODIUM 40 MG PO TBEC
40.0000 mg | DELAYED_RELEASE_TABLET | Freq: Every day | ORAL | Status: DC
  Administered 2018-12-18: 40 mg via ORAL
  Filled 2018-12-18: qty 1

## 2018-12-18 MED ORDER — PROPOFOL 10 MG/ML IV BOLUS
INTRAVENOUS | Status: AC
  Filled 2018-12-18: qty 20

## 2018-12-18 MED ORDER — LIDOCAINE HCL (CARDIAC) PF 100 MG/5ML IV SOSY
PREFILLED_SYRINGE | INTRAVENOUS | Status: DC | PRN
  Administered 2018-12-18: 40 mg via INTRAVENOUS

## 2018-12-18 MED ORDER — OXYCODONE-ACETAMINOPHEN 5-325 MG PO TABS
1.0000 | ORAL_TABLET | ORAL | Status: DC | PRN
  Administered 2018-12-18 – 2018-12-19 (×3): 1 via ORAL
  Administered 2018-12-19: 2 via ORAL
  Filled 2018-12-18: qty 2

## 2018-12-18 MED ORDER — SODIUM CHLORIDE 0.9 % IV SOLN
INTRAVENOUS | Status: DC | PRN
  Administered 2018-12-18: 60 mL

## 2018-12-18 MED ORDER — SODIUM CHLORIDE 0.9 % IV SOLN
INTRAVENOUS | Status: DC
  Filled 2018-12-18: qty 1000

## 2018-12-18 MED ORDER — MENTHOL 3 MG MT LOZG
1.0000 | LOZENGE | OROMUCOSAL | Status: DC | PRN
  Filled 2018-12-18: qty 9

## 2018-12-18 MED ORDER — FENTANYL CITRATE (PF) 100 MCG/2ML IJ SOLN
INTRAMUSCULAR | Status: AC
  Filled 2018-12-18: qty 2

## 2018-12-18 MED ORDER — PROPOFOL 10 MG/ML IV BOLUS
INTRAVENOUS | Status: DC | PRN
  Administered 2018-12-18: 80 mg via INTRAVENOUS

## 2018-12-18 MED ORDER — ACETAMINOPHEN 500 MG PO TABS
ORAL_TABLET | ORAL | Status: AC
  Filled 2018-12-18: qty 2

## 2018-12-18 MED ORDER — PANTOPRAZOLE SODIUM 40 MG PO TBEC
DELAYED_RELEASE_TABLET | ORAL | Status: AC
  Filled 2018-12-18: qty 1

## 2018-12-18 MED ORDER — KETOROLAC TROMETHAMINE 30 MG/ML IJ SOLN
30.0000 mg | Freq: Once | INTRAMUSCULAR | Status: DC
  Filled 2018-12-18: qty 1

## 2018-12-18 MED ORDER — LIDOCAINE 2% (20 MG/ML) 5 ML SYRINGE
INTRAMUSCULAR | Status: AC
  Filled 2018-12-18: qty 5

## 2018-12-18 MED ORDER — ROCURONIUM BROMIDE 100 MG/10ML IV SOLN
INTRAVENOUS | Status: DC | PRN
  Administered 2018-12-18: 50 mg via INTRAVENOUS
  Administered 2018-12-18: 20 mg via INTRAVENOUS

## 2018-12-18 SURGICAL SUPPLY — 61 items
BARRIER ADHS 3X4 INTERCEED (GAUZE/BANDAGES/DRESSINGS) IMPLANT
CHLORAPREP W/TINT 26 (MISCELLANEOUS) ×3 IMPLANT
CONT PATH 16OZ SNAP LID 3702 (MISCELLANEOUS) ×3 IMPLANT
COVER BACK TABLE 60X90IN (DRAPES) ×3 IMPLANT
COVER TIP SHEARS 8 DVNC (MISCELLANEOUS) ×1 IMPLANT
COVER TIP SHEARS 8MM DA VINCI (MISCELLANEOUS) ×2
DECANTER SPIKE VIAL GLASS SM (MISCELLANEOUS) ×9 IMPLANT
DEFOGGER SCOPE WARMER CLEARIFY (MISCELLANEOUS) ×3 IMPLANT
DERMABOND ADVANCED (GAUZE/BANDAGES/DRESSINGS) ×2
DERMABOND ADVANCED .7 DNX12 (GAUZE/BANDAGES/DRESSINGS) ×1 IMPLANT
DRAPE ARM DVNC X/XI (DISPOSABLE) ×4 IMPLANT
DRAPE COLUMN DVNC XI (DISPOSABLE) ×1 IMPLANT
DRAPE DA VINCI XI ARM (DISPOSABLE) ×8
DRAPE DA VINCI XI COLUMN (DISPOSABLE) ×2
ELECT REM PT RETURN 9FT ADLT (ELECTROSURGICAL) ×3
ELECTRODE REM PT RTRN 9FT ADLT (ELECTROSURGICAL) ×1 IMPLANT
GAUZE PETROLATUM 1 X8 (GAUZE/BANDAGES/DRESSINGS) ×3 IMPLANT
GLOVE BIO SURGEON STRL SZ 6.5 (GLOVE) ×6 IMPLANT
GLOVE BIO SURGEON STRL SZ7.5 (GLOVE) ×3 IMPLANT
GLOVE BIO SURGEONS STRL SZ 6.5 (GLOVE) ×3
GLOVE BIOGEL PI IND STRL 7.0 (GLOVE) ×5 IMPLANT
GLOVE BIOGEL PI IND STRL 7.5 (GLOVE) ×1 IMPLANT
GLOVE BIOGEL PI IND STRL 8.5 (GLOVE) ×1 IMPLANT
GLOVE BIOGEL PI INDICATOR 7.0 (GLOVE) ×10
GLOVE BIOGEL PI INDICATOR 7.5 (GLOVE) ×2
GLOVE BIOGEL PI INDICATOR 8.5 (GLOVE) ×2
GLOVE SURG SS PI 8.5 STRL IVOR (GLOVE) ×6
GLOVE SURG SS PI 8.5 STRL STRW (GLOVE) ×3 IMPLANT
IRRIG SUCT STRYKERFLOW 2 WTIP (MISCELLANEOUS) ×3
IRRIGATION SUCT STRKRFLW 2 WTP (MISCELLANEOUS) ×1 IMPLANT
IV NS IRRIG 3000ML ARTHROMATIC (IV SOLUTION) ×3 IMPLANT
LEGGING LITHOTOMY PAIR STRL (DRAPES) ×3 IMPLANT
MANIFOLD NEPTUNE II (INSTRUMENTS) ×3 IMPLANT
OBTURATOR OPTICAL STANDARD 8MM (TROCAR) ×2
OBTURATOR OPTICAL STND 8 DVNC (TROCAR) ×1
OBTURATOR OPTICALSTD 8 DVNC (TROCAR) ×1 IMPLANT
OCCLUDER COLPOPNEUMO (BALLOONS) ×3 IMPLANT
PACK ROBOT WH (CUSTOM PROCEDURE TRAY) ×3 IMPLANT
PACK ROBOTIC GOWN (GOWN DISPOSABLE) ×3 IMPLANT
PACK TRENDGUARD 450 HYBRID PRO (MISCELLANEOUS) ×1 IMPLANT
PAD PREP 24X48 CUFFED NSTRL (MISCELLANEOUS) ×3 IMPLANT
PROTECTOR NERVE ULNAR (MISCELLANEOUS) ×6 IMPLANT
SEAL CANN UNIV 5-8 DVNC XI (MISCELLANEOUS) ×3 IMPLANT
SEAL XI 5MM-8MM UNIVERSAL (MISCELLANEOUS) ×6
SEALER VESSEL DA VINCI XI (MISCELLANEOUS) ×2
SEALER VESSEL EXT DVNC XI (MISCELLANEOUS) ×1 IMPLANT
SET TRI-LUMEN FLTR TB AIRSEAL (TUBING) IMPLANT
SUT VIC AB 0 CT1 27 (SUTURE) ×4
SUT VIC AB 0 CT1 27XBRD ANBCTR (SUTURE) ×2 IMPLANT
SUT VIC AB 4-0 PS2 27 (SUTURE) ×6 IMPLANT
SUT VICRYL 0 UR6 27IN ABS (SUTURE) ×3 IMPLANT
SUT VLOC 180 0 9IN  GS21 (SUTURE) ×2
SUT VLOC 180 0 9IN GS21 (SUTURE) ×1 IMPLANT
TIP UTERINE 6.7X8CM BLUE DISP (MISCELLANEOUS) ×3 IMPLANT
TOWEL OR 17X26 10 PK STRL BLUE (TOWEL DISPOSABLE) ×3 IMPLANT
TRAP SPECIMEN MUCOUS 40CC (MISCELLANEOUS) ×3 IMPLANT
TRAY FOLEY BAG SILVER LF 14FR (CATHETERS) ×3 IMPLANT
TRENDGUARD 450 HYBRID PRO PACK (MISCELLANEOUS) ×3
TROCAR PORT AIRSEAL 8X120 (TROCAR) ×3 IMPLANT
TROCAR XCEL NON BLADE 8MM B8LT (ENDOMECHANICALS) ×3 IMPLANT
TUBING EVAC SMOKE HEATED PNEUM (TUBING) ×3 IMPLANT

## 2018-12-18 NOTE — Brief Op Note (Signed)
12/18/2018  3:46 PM  PATIENT:  Rebekah Sanchez  43 y.o. female  PRE-OPERATIVE DIAGNOSIS:  BRCA 2 Positive  POST-OPERATIVE DIAGNOSIS:  BRCA 2 Positive  PROCEDURE:  Procedure(s): XI ROBOTIC ASSISTED TOTAL HYSTERECTOMY WITH BILATERAL SALPINGO OOPHORECTOMY/Pelvic Washings (Bilateral)  SURGEON:  Surgeon(s) and Role:    * [name], MD - Primary  PHYSICIAN ASSISTANT: Artelia Laroche, CNM  ASSISTANTS: As above  ANESTHESIA:   local and general  EBL:  20 mL   BLOOD ADMINISTERED:none  DRAINS: Urinary Catheter (Foley)   LOCAL MEDICATIONS USED:  MARCAINE    and BUPIVICAINE   SPECIMEN:  Source of Specimen:  Pelvic washings, uterus and cervix with bilateral tubes and ovaries  DISPOSITION OF SPECIMEN:  PATHOLOGY  COUNTS:  YES  TOURNIQUET:  * No tourniquets in log *  DICTATION: .Note written in EPIC  PLAN OF CARE: Admit for overnight observation  PATIENT DISPOSITION:  PACU - hemodynamically stable.   Delay start of Pharmacological VTE agent (>24hrs) due to surgical blood loss or risk of bleeding: yes

## 2018-12-18 NOTE — Op Note (Signed)
12/18/2018  3:46 PM  PATIENT:  Rebekah Sanchez  43 y.o. female  PRE-OPERATIVE DIAGNOSIS:  BRCA 2 Positive  POST-OPERATIVE DIAGNOSIS:  BRCA 2 Positive  PROCEDURE:  Procedure(s): XI ROBOTIC ASSISTED TOTAL HYSTERECTOMY WITH BILATERAL SALPINGO OOPHORECTOMY/Pelvic Washings (Bilateral)  SURGEON:  Surgeon(s) and Role:    * [name], MD - Primary  PHYSICIAN ASSISTANT: Artelia Laroche, CNM  ASSISTANTS: As above  ANESTHESIA:   local and general  EBL:  20 mL   BLOOD ADMINISTERED:none  DRAINS: Urinary Catheter (Foley)   LOCAL MEDICATIONS USED:  MARCAINE    and BUPIVICAINE   SPECIMEN:  Source of Specimen:  Pelvic washings, uterus and cervix with bilateral tubes and ovaries  DISPOSITION OF SPECIMEN:  PATHOLOGY  COUNTS:  YES  TOURNIQUET:  * No tourniquets in log *  DICTATION: .Note written in EPIC  PLAN OF CARE: Admit for overnight observation  PATIENT DISPOSITION:  PACU - hemodynamically stable.   Delay start of Pharmacological VTE agent (>24hrs) due to surgical blood loss or risk of bleeding: yes    Procedure:Robotic-assisted total laparoscopic hysterectomy with BSO and pelvic washings Complications: none Antibiotics: 2 g Ancef Findings: nl b/l ovaries and tubes, small simple right paratubal cyst,  normal uterus,  Hemostasis post-procedure with peristalsis of bilateral ureters.   Indications: This is a 43 year old G3, P2 nonpregnant patient here for prophylactic hysterectomy with BSO due to BRCA2 status  Procedure: After informed consent obtained, the patient was taken to the operating room where general anesthesia was initiated without difficulty.   She was prepped and draped in normal sterile fashion in the dorsal supine lithotomy position. A Foley catheter was inserted sterilely into the bladder. A bimanual examination was done to assess the size and position of the uterus. A weighted speculum was placed in the vagina and deaver retractors were used on the anterior  vaginal wall. .The cervix was grasped with tenaculum.  Stay sutures were placed on the anterior and posterior cervical lip.  The uterus was sounded to 8 cm. The cervix was assessed to identify the Rumi-Co size.  A medium cup and an 8 cm shaft was used. The uterine balloon was inflated.  Gloved were changed. Attention was then turned to the patient's abdomen. 0.5 % marcaine was used prior to all incision. A total of 10 cc of marcaine was used.  A 10 mm incision was made in the umbilicus and blunt and sharp dissection was done until the fascia was identified. This was then grasped with Kocher clamps x2 and entered sharply. A pursestring suture of 0 Vicryl was then placed along the incision and a non-bladed Sheryle Hail was inserted into the peritoneal cavity. Intraperitoneal placement was confirmed with the use of the camera and pneumoperitoneum was created to 15 mm of mercury. The pursestring suture was secured around the port and pneumoperitoneum was maintained. Brief survey of the abdomen and pelvis was done with findings as above. The abdominal wall was assessed and additional port sites were marked. 8 mm incisions were placed in the right (two) and left lower quadrants (2) and non-bladed ports were placed under direct visualization. The robot was brought to the patient's side and attached with the right side docking. The robotic instruments were placed under direct visualization until proper placement just over the uterus.  I then went to the robotic console. Brief survey of the patient's abdomen and pelvis revealed  findings as above.Bilateral tubes and ovaries were normal.  Ureters were seen peristalsing deep in the pelvis.  No  significant adhesions were noted. Pelvic washings were taken.  I began on the left side: the  Left ureter and  IP were identified. The IP was serially cauterized about 2 cm below the ovary and transected with the vessel sealer. The left round ligament was cauterized and divided.  The  anterior leaf of the broad ligament was dissected bluntly then monopolar cautery was used to separate the anterior and posterior leafs of the broad ligament. This was carried down through to the bladder flap. Attention was then turned to the patient's  Right side: the right IP was serially cauterized and transected.  The right round  ligament was cauterized and transected. The anterior and posterior leaves of the broad ligament were bluntly and sharply dissected apart from each other. Monopolar cautery was used to come across the anterior leaf of the right broad ligament. This dissection was carried down across to the midline to create the bladder flap. The leftt uterine artery was serially cauterized with the vessel sealer and transected.  The right uterine artery and then serially cauterized with the vessel sealer and transected. The uterus was placed on stretch to allow better visualization of the arteries. The bladder flap was further taken down both sharply and with cautery. Cautery was used for hemostasis on several places along the bladder flap. At this point with the pressure inward on the Rumi, the anterior colpotomy was made with the monopolar scissors. This was carried around to the patient's right side. Additional bipolar cautery was used on the  both angles of the cuff- this was carried out with the vessel sealer. The uterus was then positioned  anteriorly  to allow easy access to the posterior  colpotomy. This was carried out with the monopolar scissors.  Good hemostasis was noted along the angles of the incision.  With manipulation of the specimen, the uterus, cervix,  bilateral tubes and ovaries were pulled out through the vagina.   Irrigation was used and the vaginal cuff appeared hemostatic. A 9 inch V-lock suture was placed though the umbilical port. Instruments were changed to allow a suture cut needle driver through the #1 port and and a long-tipped forceps through the #2 port. The right  angle was closed and locked with the V-lock suture. Running suture was carried out tothe L angle.  A more superficial layer of suture was placed travelling back to the midline. The suture was cut and removed. Excellent hemostasis was noted. Suction irrigation was carried out and hemostasis was assured along the vaginal cuff. The pedicles were reassessed also found to be hemostatic. All free fluid was removed from the abdomen. The robotic portion was completed. The robot was undocked.   By standard laparoscopy the pelvis was irrigated and evaluated. The patient was placed in reverse Trendelenburg, all additional fluid was suctioned from the abdomen and pelvis the cuff was reinspected and  found to be hemostatic. All pedicles were also found to be hemostatic. 20 CC 1/4% bupivicaine was placed into the peritoneal cavity.  Under direct visualization the ports were removed. Pneumoperitoneum was released and the umbilical port was removed.  The  pursestring suture at the umbilicus was then closed. No additional fascial incisions were closed due to the small size and the nondilated nature of these incisions. A 4-0 Vicryl was used to close the additional laparoscopic ports sites. Good hemostasis was noted.  Sponge lap and needle counts were correct x3 the patient was woken from general anesthesia having tolerated the procedure well and taken  to the recovery room in a stable fashion.

## 2018-12-18 NOTE — Transfer of Care (Signed)
Immediate Anesthesia Transfer of Care Note  Patient: Rebekah Sanchez  Procedure(s) Performed: XI ROBOTIC ASSISTED TOTAL HYSTERECTOMY WITH BILATERAL SALPINGO OOPHORECTOMY/Pelvic Washings (Bilateral Abdomen)  Patient Location: PACU  Anesthesia Type:General  Level of Consciousness: awake, alert  and oriented  Airway & Oxygen Therapy: Patient Spontanous Breathing  Post-op Assessment: Report given to RN, Post -op Vital signs reviewed and stable and Patient moving all extremities  Post vital signs: Reviewed and stable  Last Vitals:  Vitals Value Taken Time  BP 111/70 12/18/18 1545  Temp 36.6 C 12/18/18 1545  Pulse 91 12/18/18 1551  Resp 17 12/18/18 1551  SpO2 98 % 12/18/18 1551  Vitals shown include unvalidated device data.  Last Pain:  Vitals:   12/18/18 1141  TempSrc: Oral  PainSc: 2       Patients Stated Pain Goal: 6 (48/88/91 [id number])  Complications: No apparent anesthesia complications

## 2018-12-18 NOTE — Anesthesia Postprocedure Evaluation (Signed)
Anesthesia Post Note  Patient: Rebekah Sanchez  Procedure(s) Performed: XI ROBOTIC ASSISTED TOTAL HYSTERECTOMY WITH BILATERAL SALPINGO OOPHORECTOMY/Pelvic Washings (Bilateral Abdomen)     Patient location during evaluation: PACU Anesthesia Type: General Level of consciousness: awake and alert Pain management: pain level controlled Vital Signs Assessment: post-procedure vital signs reviewed and stable Respiratory status: spontaneous breathing, nonlabored ventilation and respiratory function stable Cardiovascular status: blood pressure returned to baseline and stable Postop Assessment: no apparent nausea or vomiting Anesthetic complications: no    Last Vitals:  Vitals:   12/18/18 1630 12/18/18 1634  BP: 106/74   Pulse: 89   Resp: 18   Temp:  36.8 C  SpO2: 99%     Last Pain:  Vitals:   12/18/18 1615  TempSrc:   PainSc: 4                  Mckynzi Cammon,W. EDMOND

## 2018-12-18 NOTE — Anesthesia Preprocedure Evaluation (Addendum)
Anesthesia Evaluation  Patient identified by MRN, date of birth, ID band Patient awake    Reviewed: Allergy & Precautions, NPO status , Patient's Chart, lab work & pertinent test results  Airway Mallampati: I  TM Distance: >3 FB Neck ROM: Full    Dental no notable dental hx. (+) Teeth Intact, Dental Advisory Given   Pulmonary neg pulmonary ROS,    Pulmonary exam normal breath sounds clear to auscultation       Cardiovascular negative cardio ROS Normal cardiovascular exam Rhythm:Regular Rate:Normal     Neuro/Psych negative neurological ROS  negative psych ROS   GI/Hepatic Neg liver ROS, GERD  Medicated,  Endo/Other  negative endocrine ROS  Renal/GU negative Renal ROS  negative genitourinary   Musculoskeletal negative musculoskeletal ROS (+)   Abdominal   Peds  Hematology  (+) Blood dyscrasia (Hgb 11.9), anemia ,   Anesthesia Other Findings   Reproductive/Obstetrics                            Anesthesia Physical Anesthesia Plan  ASA: II  Anesthesia Plan: General   Post-op Pain Management:    Induction: Intravenous  PONV Risk Score and Plan: Midazolam, Dexamethasone and Ondansetron  Airway Management Planned: Oral ETT  Additional Equipment:   Intra-op Plan:   Post-operative Plan: Extubation in OR  Informed Consent: I have reviewed the patients History and Physical, chart, labs and discussed the procedure including the risks, benefits and alternatives for the proposed anesthesia with the patient or authorized representative who has indicated his/her understanding and acceptance.     Dental advisory given  Plan Discussed with: CRNA  Anesthesia Plan Comments:         Anesthesia Quick Evaluation

## 2018-12-18 NOTE — Anesthesia Procedure Notes (Signed)
Procedure Name: Intubation Date/Time: 12/18/2018 1:24 PM Performed by: Hewitt Blade, CRNA Pre-anesthesia Checklist: Patient identified, Emergency Drugs available, Suction available and Patient being monitored Patient Re-evaluated:Patient Re-evaluated prior to induction Oxygen Delivery Method: Circle system utilized Preoxygenation: Pre-oxygenation with 100% oxygen Induction Type: IV induction Ventilation: Mask ventilation without difficulty Laryngoscope Size: Mac and 3 Grade View: Grade I Tube type: Oral Tube size: 7.0 mm Number of attempts: 1 Airway Equipment and Method: Stylet Placement Confirmation: ETT inserted through vocal cords under direct vision,  positive ETCO2 and breath sounds checked- equal and bilateral Secured at: 21 cm Tube secured with: Tape Dental Injury: Teeth and Oropharynx as per pre-operative assessment

## 2018-12-18 NOTE — H&P (Signed)
See paper H&P for full details  History of present illness: 43 year old G3, P2 with recent reclassification of variant and of known significance to BRCA2 pathologic variant.  Patient with family history of breast and ovarian cancer.  Patient has had full counseling via oncology and geneticist and elects to proceed with prophylactic hysterectomy with bilateral salpingo-oophorectomy and pelvic washings.  Plan is for postop HRT.  Patient with negative endometrial biopsy and up-to-date on normal Pap smear.  Patient had recent ultrasound showing uterus 6 x 5 x 3 cm  Patient's past medical history significant for constipation and anemia  Past Medical History:  Diagnosis Date  . Anemia   . Family history of breast cancer   . Family history of ovarian cancer   . Family history of stomach cancer   . Family hx of colon cancer    Mother  . History of Helicobacter pylori infection    WHEN SHE WAS 18    Past Surgical History:  Procedure Laterality Date  . NO PAST SURGERIES      No known drug allergies  Physical exam: Vitals:   12/18/18 1141  BP: 108/74  Pulse: 73  Resp: 16  Temp: 97.9 F (36.6 C)  TempSrc: Oral  SpO2: 100%  Weight: 55.8 kg  Height: '5\' 3"'  (1.6 m)   General: Well-appearing, no distress Abdomen: Soft, thin, nondistended, nontender Lower extremities: Nontender, no edema GU deferred to the OR  CBC    Component Value Date/Time   WBC 12.7 (H) 12/10/2018 1502   RBC 4.13 12/10/2018 1502   HGB 11.9 (L) 12/10/2018 1502   HGB 12.5 05/15/2018 0834   HCT 37.7 12/10/2018 1502   PLT 397 12/10/2018 1502   PLT 350 05/15/2018 0834   MCV 91.3 12/10/2018 1502   MCH 28.8 12/10/2018 1502   MCHC 31.6 12/10/2018 1502   RDW 13.1 12/10/2018 1502   LYMPHSABS 1.6 05/15/2018 0834   MONOABS 0.7 05/15/2018 0834   EOSABS 0.6 (H) 05/15/2018 0834   BASOSABS 0.0 05/15/2018 0834    Assessment plan: 43 year old G3, P2 nonpregnant patient with pathologic BRCA2 variant for prophylactic  hysterectomy with bilateral salpingo-oophorectomy and pelvic washings. Risk benefits of surgery discussed with patient who agrees to proceed Plan postop HRT  Ala Dach 12/18/2018 1:13 PM

## 2018-12-19 DIAGNOSIS — Z1501 Genetic susceptibility to malignant neoplasm of breast: Secondary | ICD-10-CM | POA: Diagnosis not present

## 2018-12-19 LAB — CBC
HCT: 32.1 % — ABNORMAL LOW (ref 36.0–46.0)
Hemoglobin: 10.1 g/dL — ABNORMAL LOW (ref 12.0–15.0)
MCH: 28.6 pg (ref 26.0–34.0)
MCHC: 31.5 g/dL (ref 30.0–36.0)
MCV: 90.9 fL (ref 80.0–100.0)
Platelets: 339 10*3/uL (ref 150–400)
RBC: 3.53 MIL/uL — ABNORMAL LOW (ref 3.87–5.11)
RDW: 12.8 % (ref 11.5–15.5)
WBC: 12.2 10*3/uL — ABNORMAL HIGH (ref 4.0–10.5)
nRBC: 0 % (ref 0.0–0.2)

## 2018-12-19 MED ORDER — IBUPROFEN 600 MG PO TABS: 600.0000 mg | ORAL_TABLET | Freq: Four times a day (QID) | ORAL | 1 refills | Status: AC | PRN

## 2018-12-19 MED ORDER — ONDANSETRON HCL 4 MG/2ML IJ SOLN
INTRAMUSCULAR | Status: AC
  Filled 2018-12-19: qty 2

## 2018-12-19 MED ORDER — OXYCODONE-ACETAMINOPHEN 5-325 MG PO TABS
ORAL_TABLET | ORAL | Status: AC
  Filled 2018-12-19: qty 2

## 2018-12-19 MED ORDER — IBUPROFEN 200 MG PO TABS
ORAL_TABLET | ORAL | Status: AC
  Filled 2018-12-19: qty 4

## 2018-12-19 MED ORDER — OXYCODONE-ACETAMINOPHEN 5-325 MG PO TABS
ORAL_TABLET | ORAL | Status: AC
  Filled 2018-12-19: qty 1

## 2018-12-19 MED ORDER — OXYCODONE-ACETAMINOPHEN 5-325 MG PO TABS: 2.0000 | ORAL_TABLET | Freq: Four times a day (QID) | ORAL | 0 refills | Status: AC | PRN

## 2018-12-19 NOTE — Discharge Summary (Signed)
Rebekah Sanchez MRN: [id number] DOB/AGE: 41-Mar-1977 41 y.o.  Admit date: 12/18/2018 Discharge date: December 19, 2018  Admission Diagnoses: BRCA 2 Positive  Discharge Diagnoses: BRCA 2 Positive        Active Problems:   BRCA2 positive   Status post hysterectomy with oophorectomy   Discharged Condition: good  Hospital Course: Patient admitted for robotic assisted total laparoscopic hysterectomy with bilateral salpingo-oophorectomy and pelvic washings.  Patient had surgery as planned without any complications.  On postop day 1 patient does admit to single episode of emesis last night after walking and voiding but since then she has done well.  She has ambulated without additional difficulties.  She is tolerating regular p.o. she has voided several times since the Foley catheter has been removed.  She reports almost no vaginal bleeding and pain controlled by p.o. pain meds.  She has not had flatus or bowel movement yet  Physical exam Vitals:   12/18/18 1843 12/18/18 2206 12/19/18 0200 12/19/18 0600  BP: 101/71 1'03/68 95/66 90/61 '  Pulse: 64 68 64 (!) 55  Resp: '16 18 18 18  ' Temp: 98.1 F (36.7 C) 98.3 F (36.8 C) 98.2 F (36.8 C) 98.2 F (36.8 C)  TempSrc:      SpO2: 99% 100% 100% 99%  Weight:      Height:       General: Well-appearing, no distress Abdomen, soft distention, appropriately tender, no rebound or guarding GU: Minimal staining Skin: Laparoscopic port incisions well approximated with minimal bruising around the umbilical incision and Dermabond in place Lower extremity: Nontender, no edema    Consults: None  Treatments: surgery: Robotic assisted total laparoscopic hysterectomy with bilateral salpingo-oophorectomy  Disposition: Discharge disposition: 01-Home or Self Care        Allergies as of 12/19/2018      Reactions   Hyoscyamine Sulfate Other (See Comments)   Headaches      Medication List    STOP taking these medications   Ferrex 150 150 MG  capsule Generic drug: iron polysaccharides     TAKE these medications   B-complex with vitamin C tablet Take 1 tablet by mouth daily.   calcium carbonate 750 MG chewable tablet Commonly known as: TUMS EX Chew 1 tablet by mouth 2 (two) times daily.   docusate sodium 100 MG capsule Commonly known as: COLACE Take 100 mg by mouth daily. Taking 2 pills at night   ibuprofen 600 MG tablet Commonly known as: ADVIL Take 1 tablet (600 mg total) by mouth every 6 (six) hours as needed for moderate pain. What changed:   medication strength  how much to take  reasons to take this   oxyCODONE-acetaminophen 5-325 MG tablet Commonly known as: PERCOCET/ROXICET Take 2 tablets by mouth every 6 (six) hours as needed for up to 5 days for moderate pain.   Vitamin D 50 MCG (2000 UT) Caps Take 4,000 Units by mouth daily.   vitamin E 400 UNIT capsule Take 400 Units by mouth daily.   Zinc 15 MG Caps Take 15 mg by mouth daily.        Signed: Ala Dach, MD 12/19/2018, 8:47 AM

## 2018-12-21 ENCOUNTER — Other Ambulatory Visit: Payer: Self-pay | Admitting: Hematology and Oncology

## 2018-12-21 ENCOUNTER — Encounter (HOSPITAL_BASED_OUTPATIENT_CLINIC_OR_DEPARTMENT_OTHER): Payer: Self-pay | Admitting: Obstetrics

## 2018-12-21 DIAGNOSIS — Z1231 Encounter for screening mammogram for malignant neoplasm of breast: Secondary | ICD-10-CM

## 2018-12-21 LAB — SURGICAL PATHOLOGY

## 2018-12-21 LAB — CYTOLOGY - NON PAP

## 2018-12-21 NOTE — Addendum Note (Signed)
Addendum  created 12/21/18 0951 by Freddrick March, MD   Intraprocedure Staff edited

## 2018-12-21 NOTE — Addendum Note (Signed)
Addendum  created 12/21/18 1008 by Wanita Chamberlain, CRNA   Charge Capture section accepted

## 2019-02-02 ENCOUNTER — Ambulatory Visit: Payer: 59

## 2019-03-09 ENCOUNTER — Ambulatory Visit
Admission: RE | Admit: 2019-03-09 | Discharge: 2019-03-09 | Disposition: A | Discharge status: Home / Self Care | Payer: 59 | Source: Ambulatory Visit | Attending: Hematology and Oncology | Admitting: Hematology and Oncology

## 2019-03-09 ENCOUNTER — Other Ambulatory Visit: Payer: Self-pay

## 2019-03-09 DIAGNOSIS — Z1231 Encounter for screening mammogram for malignant neoplasm of breast: Secondary | ICD-10-CM

## 2019-03-09 IMAGING — MG DIGITAL SCREENING BILAT W/ TOMO W/ CAD
6 of 10 series · 6 of 30 positions shown · non-contrast
Comparison: Previous exam(s).

CLINICAL DATA: Screening.

EXAM:
DIGITAL SCREENING BILATERAL MAMMOGRAM WITH TOMO AND CAD

[R CC synth-2D (1 of 2)]
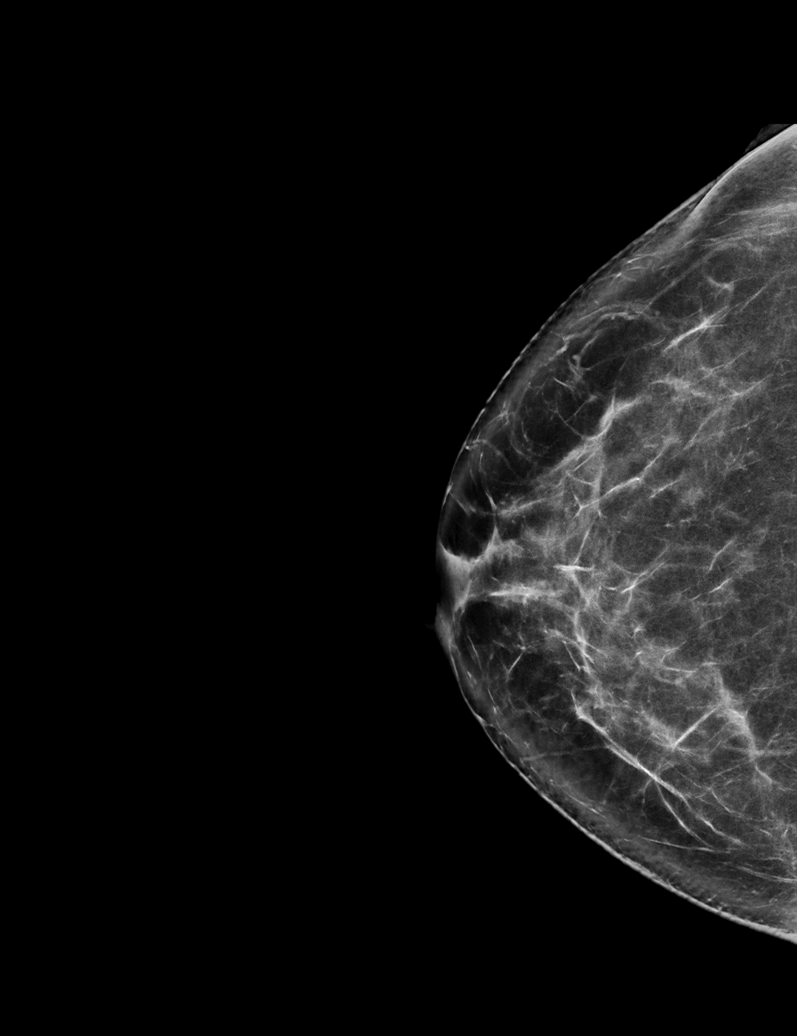

[L MLO synth-2D]
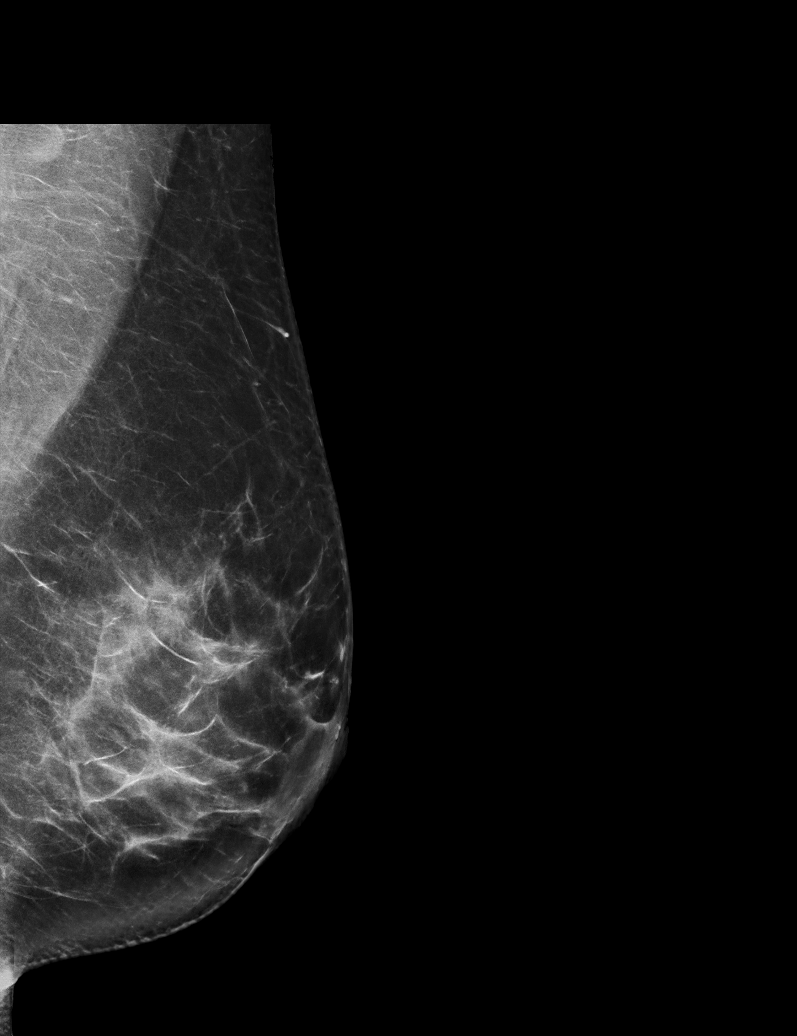

[R MLO synth-2D]
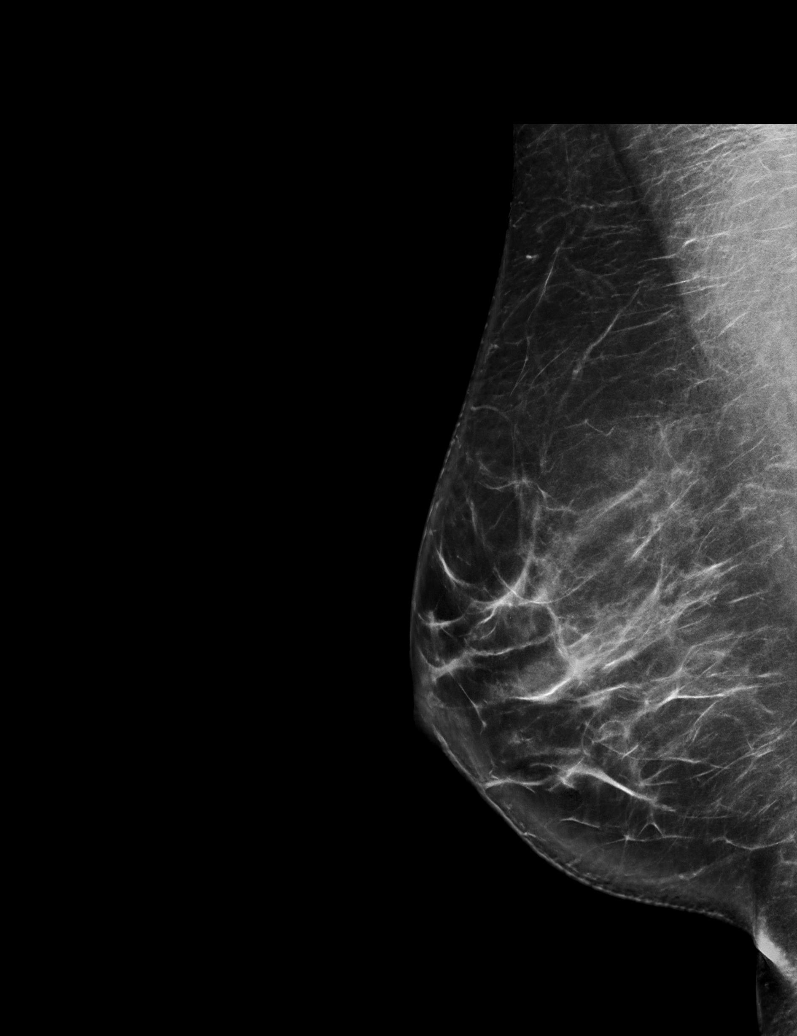

[R CC synth-2D (2 of 2)]
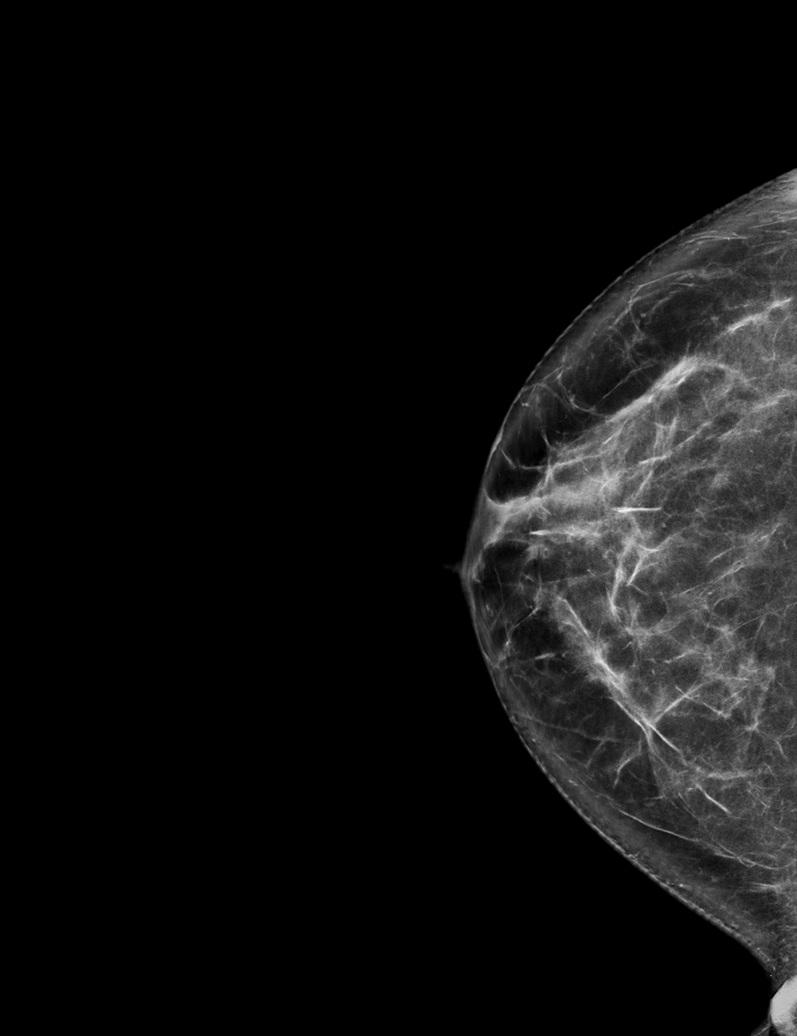

[L CC synth-2D]
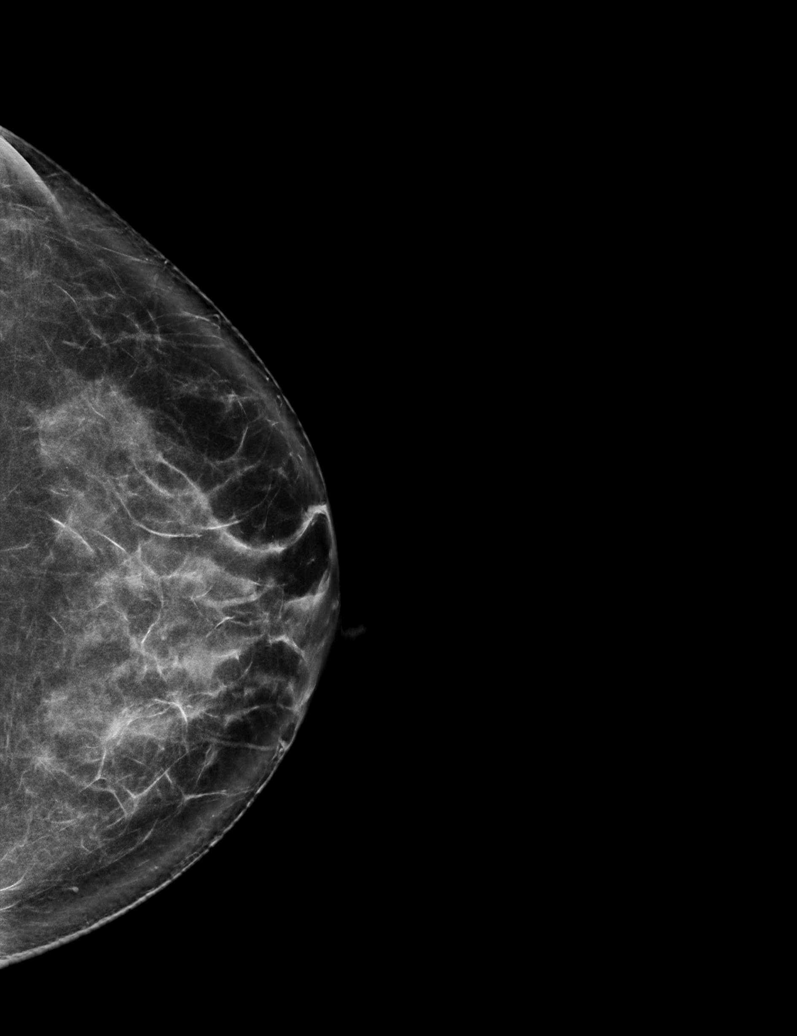

[R MLO tomo · tomo slice 41/82.0]
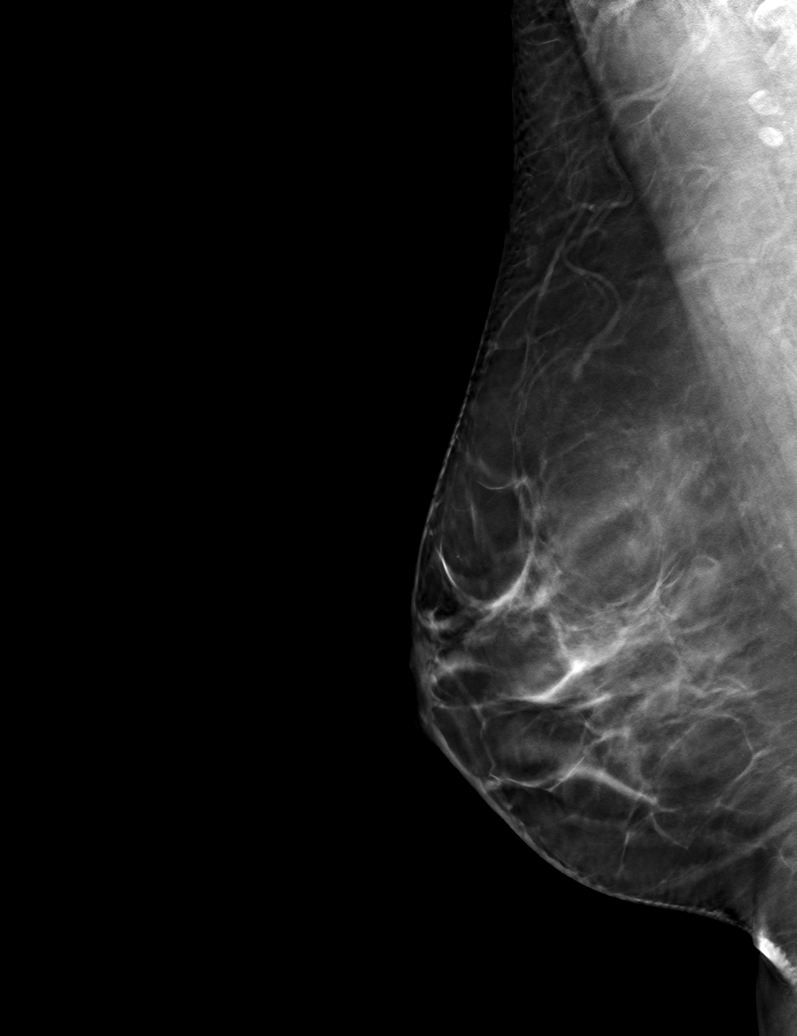

[6 of 30 positions shown; findings below may reference images not displayed]

ACR Breast Density Category c: The breast tissue is heterogeneously
dense, which may obscure small masses.
FINDINGS: There are no findings suspicious for malignancy. Images were
processed with CAD.
IMPRESSION: No mammographic evidence of malignancy. A result letter of this
screening mammogram will be mailed directly to the patient.

RECOMMENDATION:
Screening mammogram in one year. (Code:[MH])

BI-RADS CATEGORY  1: Negative.

The 9 please Insert Correct Screening Template

## 2019-03-10 ENCOUNTER — Ambulatory Visit (HOSPITAL_COMMUNITY)
Admission: RE | Admit: 2019-03-10 | Discharge: 2019-03-10 | Disposition: A | Discharge status: Home / Self Care | Payer: 59 | Source: Ambulatory Visit | Attending: Hematology and Oncology | Admitting: Hematology and Oncology

## 2019-03-10 ENCOUNTER — Telehealth: Payer: Self-pay | Admitting: Hematology and Oncology

## 2019-03-10 ENCOUNTER — Encounter (HOSPITAL_COMMUNITY): Payer: Self-pay | Admitting: Radiology

## 2019-03-10 DIAGNOSIS — R634 Abnormal weight loss: Secondary | ICD-10-CM | POA: Insufficient documentation

## 2019-03-10 DIAGNOSIS — Z1231 Encounter for screening mammogram for malignant neoplasm of breast: Secondary | ICD-10-CM | POA: Diagnosis not present

## 2019-03-10 IMAGING — CT CT ABD-PELV W/ CM
3 of 9 series · 15 of 46 positions shown, 17 images · IV contrast (OMNIPAQUE)
Comparison: None.

CLINICAL DATA: Weight loss, [TS] mutation, evaluate for pancreatic
cancer

EXAM:
CT ABDOMEN AND PELVIS WITH CONTRAST
TECHNIQUE: Multidetector CT imaging of the abdomen and pelvis was performed
using the standard protocol following bolus administration of
intravenous contrast.
CONTRAST:  100mL OMNIPAQUE IOHEXOL 300 MG/ML SOLN, additional oral
enteric contrast

[Series 3: coronal arterial · coronal · arterial · 0.53mm/px · 3 of 76 slices shown]
[im 19/76  soft-tissue]
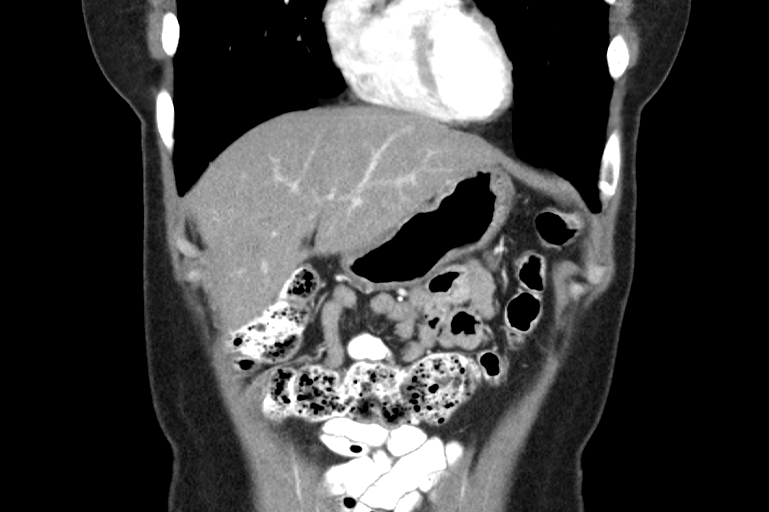
[im 38/76  soft-tissue]
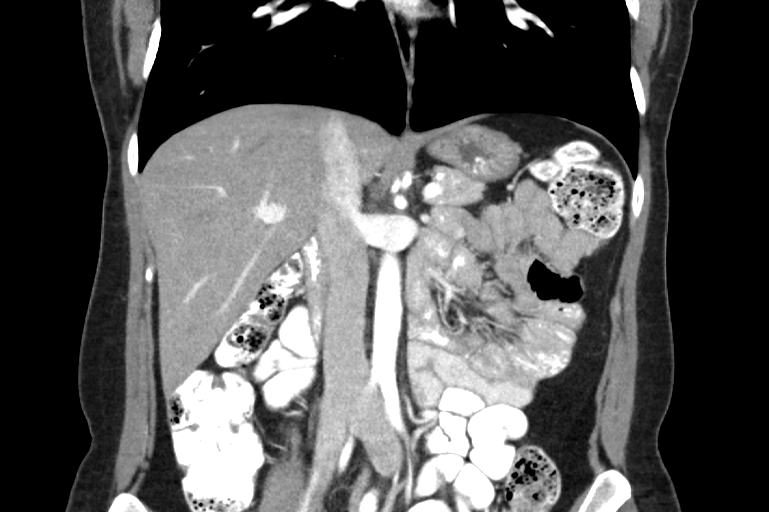
[im 57/76  soft-tissue]
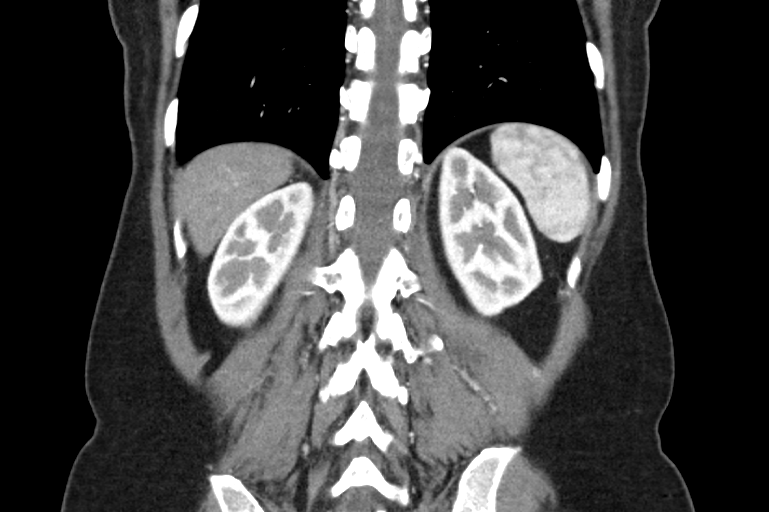

[Series 6: lung arterial · axial · arterial · 0.67mm/px · z∈[+1410,+1440]mm · 2 of 60 slices shown]
[im 15/60  bone]
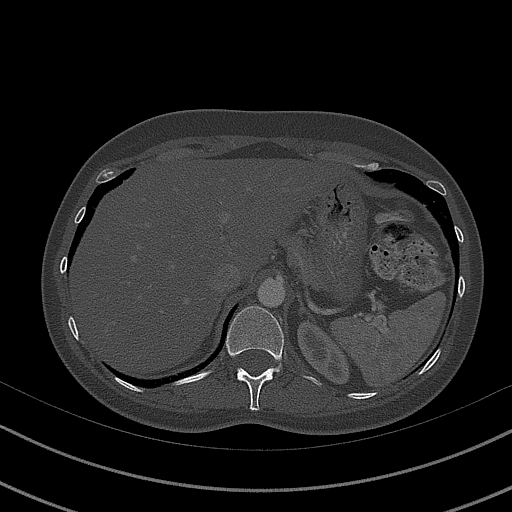
[im 30/60  bone]
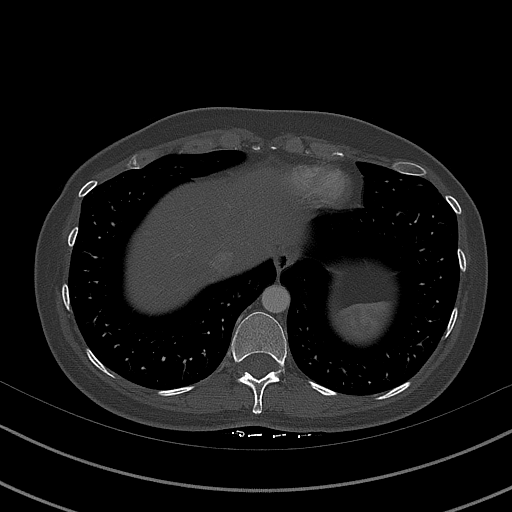

[Series 7: axial venous · axial · portal-venous · 0.67mm/px · z∈[+1113,+1461]mm · 10 of 142 slices shown, 12 images]
[im 13/142  soft-tissue]
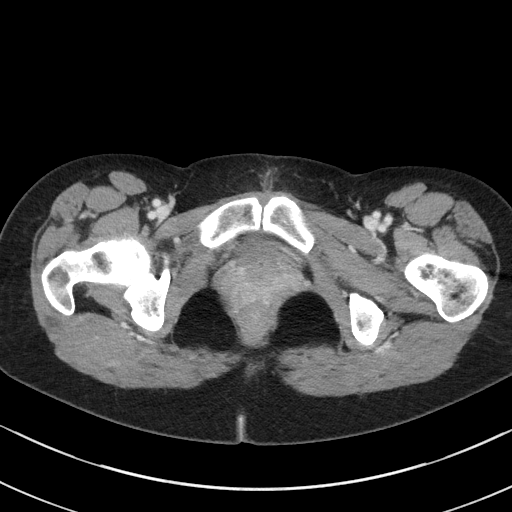
[im 13/142  bone]
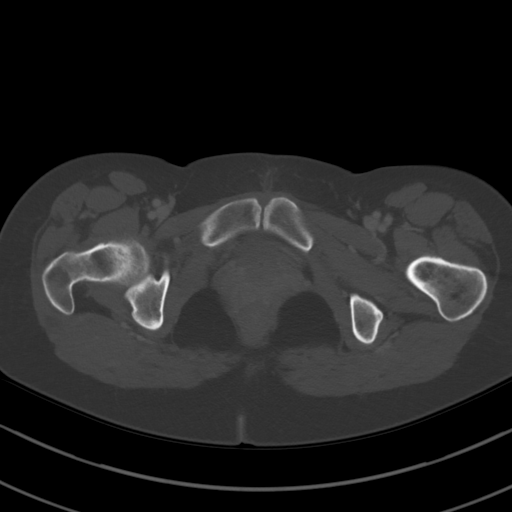
[im 26/142  soft-tissue]
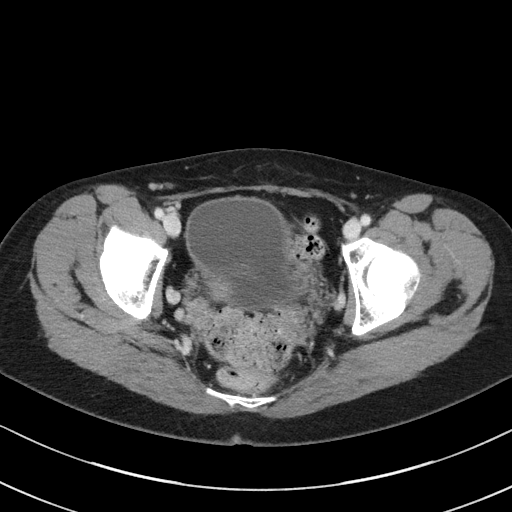
[im 39/142  soft-tissue]
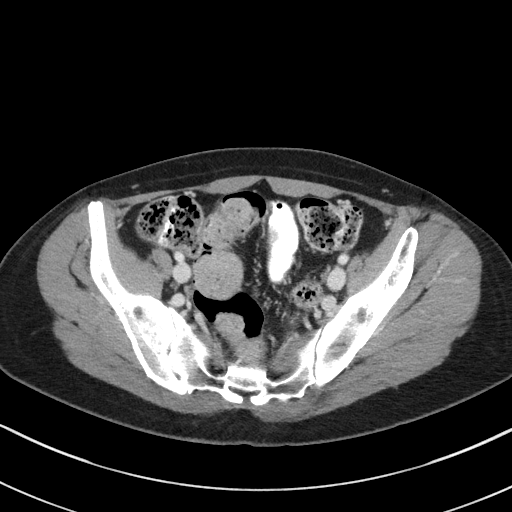
[im 52/142  soft-tissue]
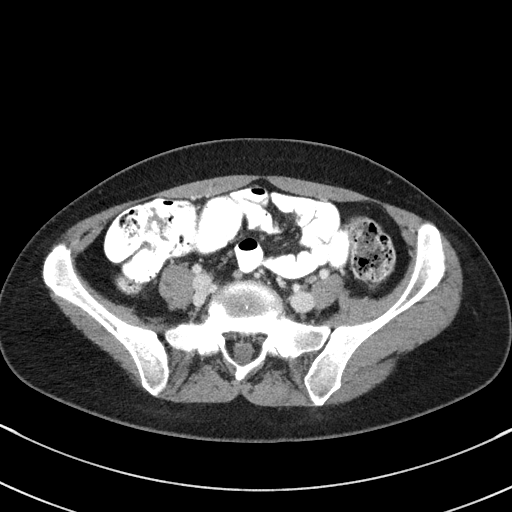
[im 65/142  soft-tissue]
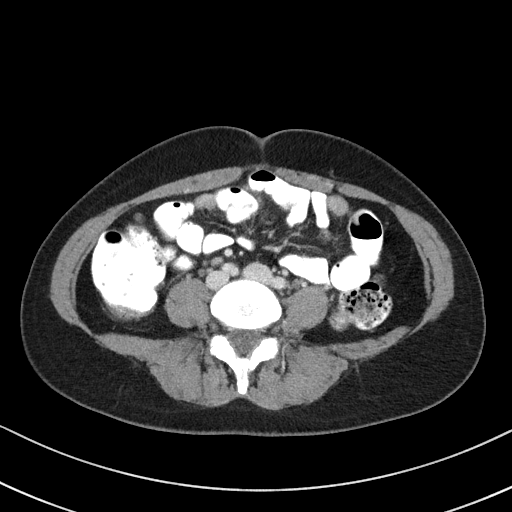
[im 77/142  soft-tissue]
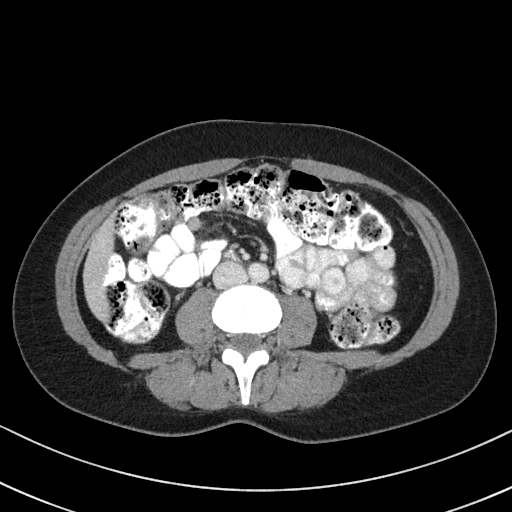
[im 90/142  soft-tissue]
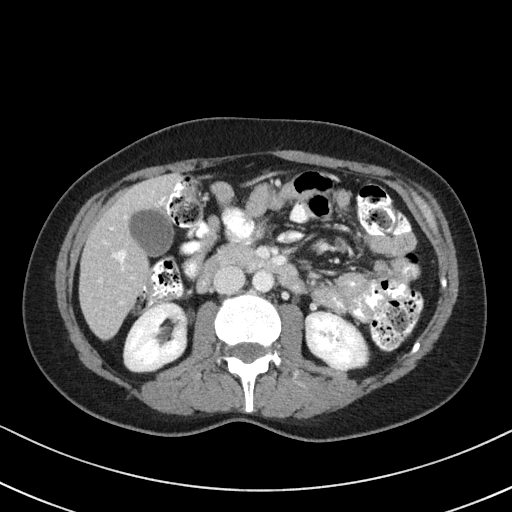
[im 103/142  soft-tissue]
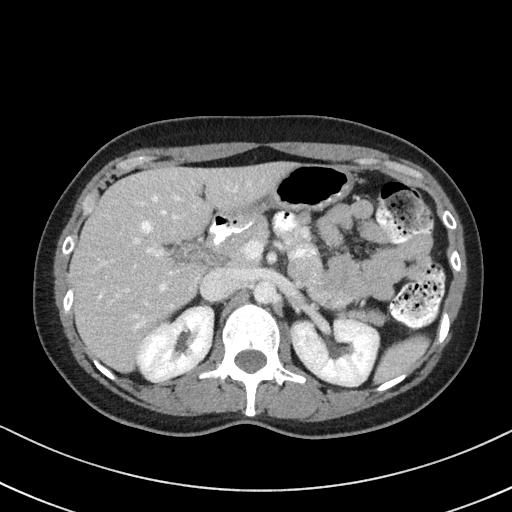
[im 116/142  soft-tissue]
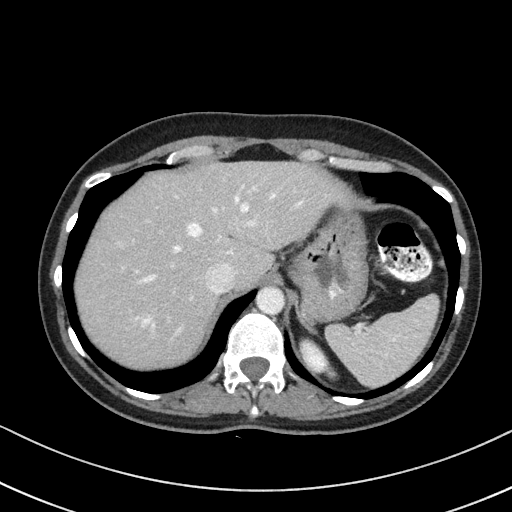
[im 116/142  bone]
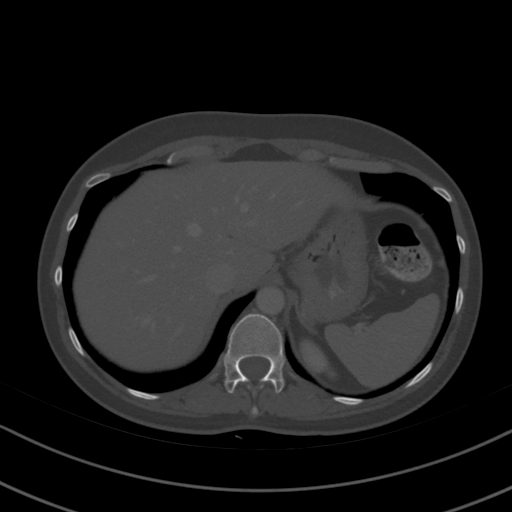
[im 129/142  soft-tissue]
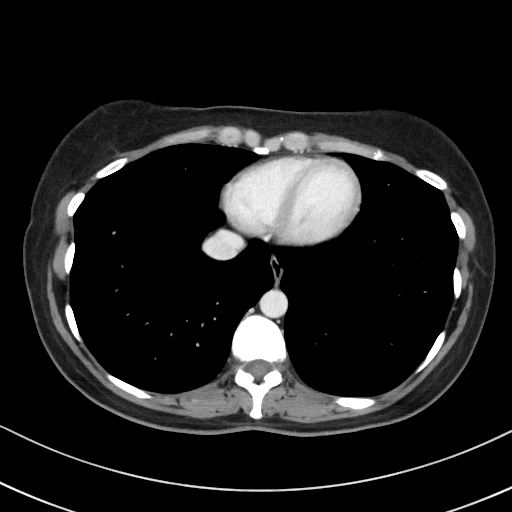

[15 of 46 positions shown; findings below may reference images not displayed]

FINDINGS: Lower chest: No acute abnormality.

Hepatobiliary: No solid liver abnormality is seen. No gallstones,
gallbladder wall thickening, or biliary dilatation.

Pancreas: Unremarkable. No pancreatic ductal dilatation or
surrounding inflammatory changes.

Spleen: Normal in size without significant abnormality.

Adrenals/Urinary Tract: Adrenal glands are unremarkable. Kidneys are
normal, without renal calculi, solid lesion, or hydronephrosis.
Bladder is unremarkable.

Stomach/Bowel: Stomach is within normal limits. Appendix appears
normal. No evidence of bowel wall thickening, distention, or
inflammatory changes. Large burden of stool in the distal colon and
rectum.

Vascular/Lymphatic: No significant vascular findings are present. No
enlarged abdominal or pelvic lymph nodes.

Reproductive: Status post hysterectomy.

Other: No abdominal wall hernia or abnormality. No abdominopelvic
ascites.

Musculoskeletal: No acute or significant osseous findings.
IMPRESSION: 1. No evidence of pancreatic mass or secondary findings of
pancreatic malignancy such as biliary or pancreatic ductal
dilatation, lymphadenopathy, or metastatic disease.
2. No other findings in the abdomen or pelvis to explain abnormal
weight loss.
3. Status post hysterectomy.

## 2019-03-10 MED ORDER — SODIUM CHLORIDE (PF) 0.9 % IJ SOLN
INTRAMUSCULAR | Status: AC
  Filled 2019-03-10: qty 50

## 2019-03-10 MED ORDER — IOHEXOL 300 MG/ML  SOLN
100.0000 mL | Freq: Once | INTRAMUSCULAR | Status: AC | PRN
  Administered 2019-03-10: 09:00:00 100 mL via INTRAVENOUS

## 2019-03-10 NOTE — Telephone Encounter (Signed)
I informed the patient of the CT chest abdomen and pelvis did not show any evidence of pancreatic masses or any other malignancy. Her mammograms done yesterday were normal

## 2019-03-19 ENCOUNTER — Ambulatory Visit: Payer: 59

## 2019-06-08 ENCOUNTER — Other Ambulatory Visit: Payer: 59

## 2019-06-21 ENCOUNTER — Telehealth: Payer: Self-pay | Admitting: Hematology and Oncology

## 2019-06-21 ENCOUNTER — Other Ambulatory Visit: Payer: Self-pay

## 2019-06-21 ENCOUNTER — Ambulatory Visit
Admission: RE | Admit: 2019-06-21 | Discharge: 2019-06-21 | Disposition: A | Discharge status: Home / Self Care | Payer: 59 | Source: Ambulatory Visit | Attending: Hematology and Oncology | Admitting: Hematology and Oncology

## 2019-06-21 DIAGNOSIS — Z1231 Encounter for screening mammogram for malignant neoplasm of breast: Secondary | ICD-10-CM

## 2019-06-21 IMAGING — MR MR BREAST BILAT WO/W CM
8 of 12 series · 30 of 48 positions shown · IV contrast (of gadavist)
Comparison: Previous exam(s).

CLINICAL DATA: 44-year-old female with BRCA mutation.

LABS:  None performed on site.
EXAM:
BILATERAL BREAST MRI WITH AND WITHOUT CONTRAST
TECHNIQUE: Multiplanar, multisequence MR images of both breasts were obtained
prior to and following the intravenous administration of 10 ml of
Gadavist.

[Series 2: t2_tirm_tra ipat (a-p) · axial · 3.0mm · 0.70mm/px · 1 of 60 slices shown]
[im 1/60]
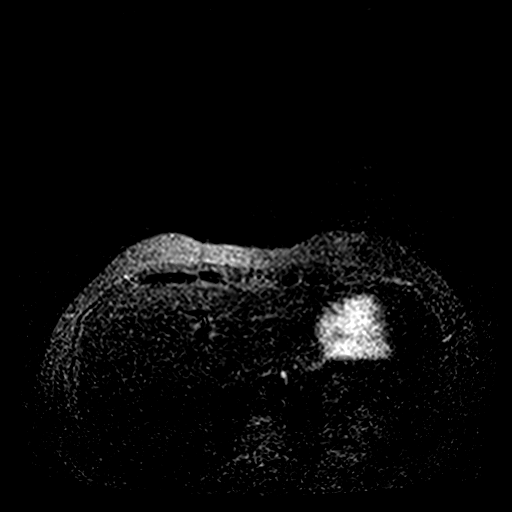

[Series 3: fl3d pre-cm no · axial · non-contrast · 0.9mm · 0.94mm/px · z∈[-83,+75]mm · 5 of 176 slices shown]
[im 1/176]
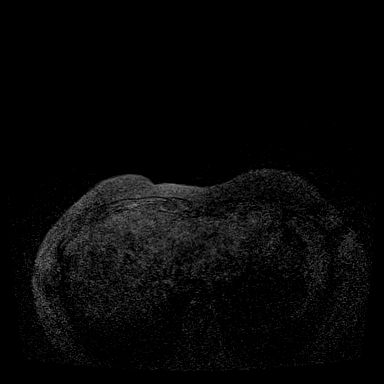
[im 44/176]
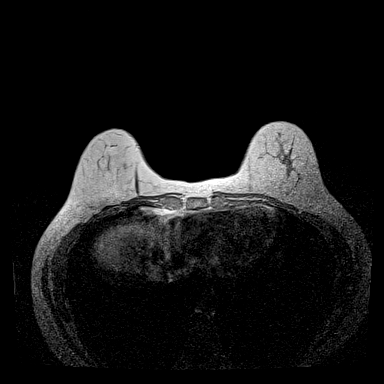
[im 88/176]
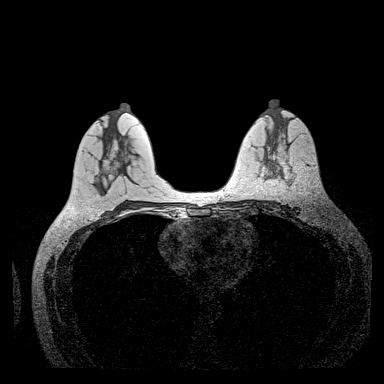
[im 132/176]
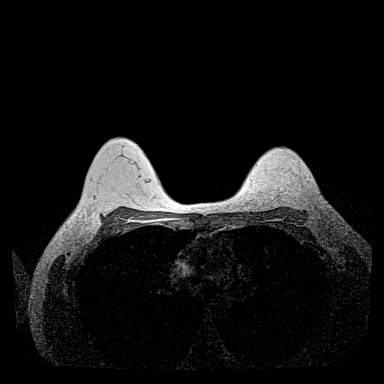
[im 176/176]
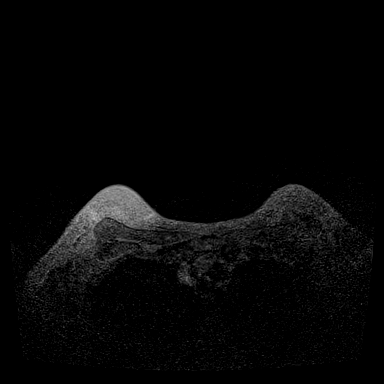

[Series 4: fl3d pre-cm · axial · non-contrast · 0.9mm · 0.87mm/px · z∈[-83,+75]mm · 5 of 176 slices shown]
[im 1/176]
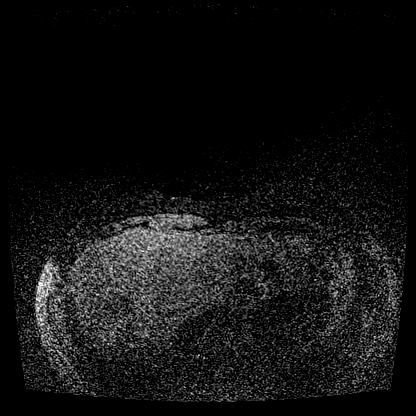
[im 44/176]
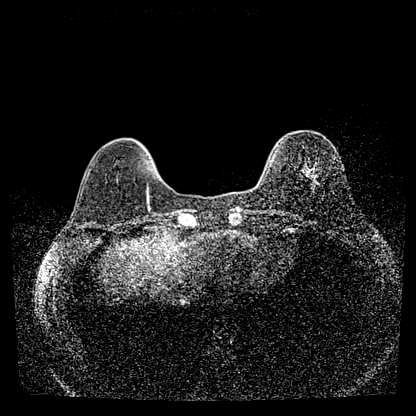
[im 88/176]
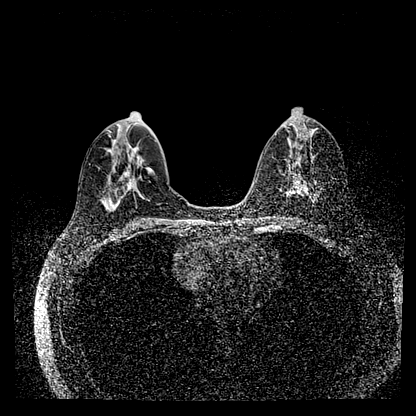
[im 132/176]
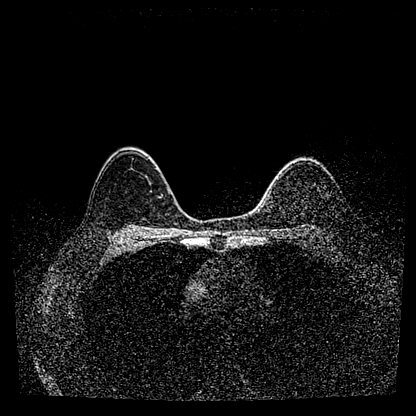
[im 176/176]
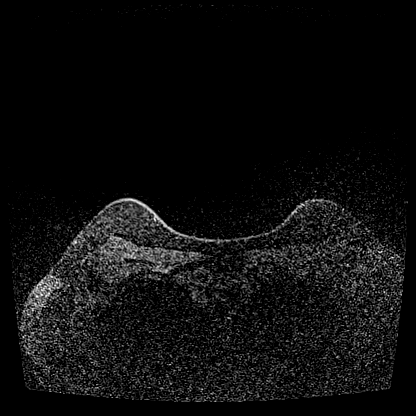

[Series 5: fl3d post-cm 20 · axial · 0.9mm · 0.87mm/px · z∈[-83,+75]mm · 5 of 176 slices shown (1 of 3)]
[im 1/176]
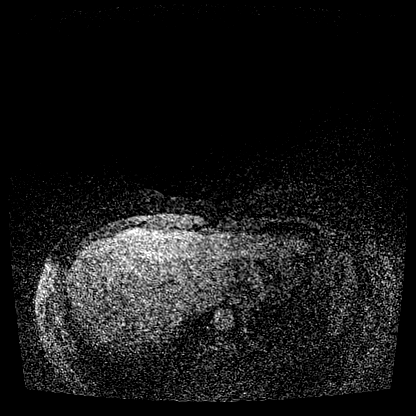
[im 44/176]
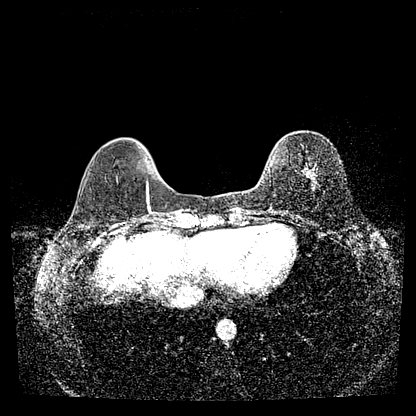
[im 88/176]
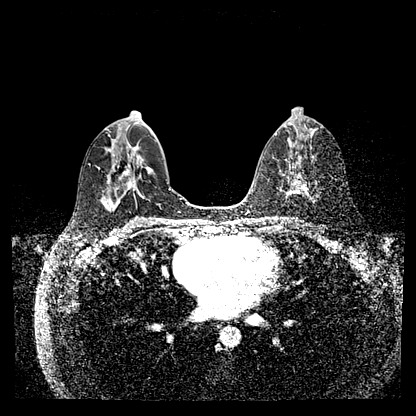
[im 132/176]
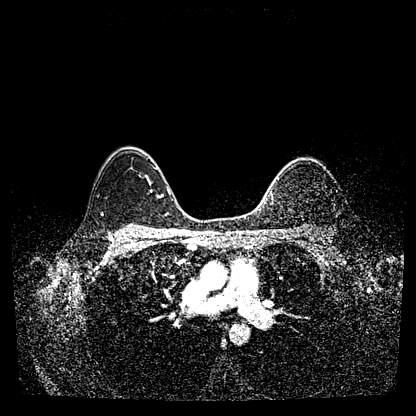
[im 176/176]
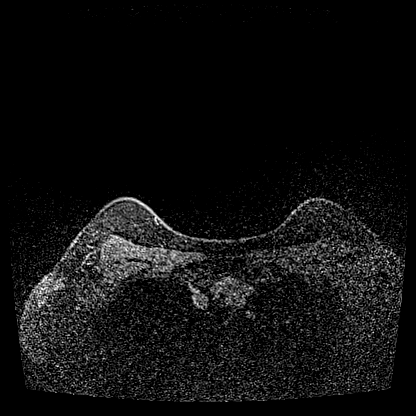

[Series 6: fl3d post-cm 20 · axial · 0.9mm · 0.87mm/px · z∈[-83,+75]mm · 5 of 176 slices shown (2 of 3)]
[im 1/176]
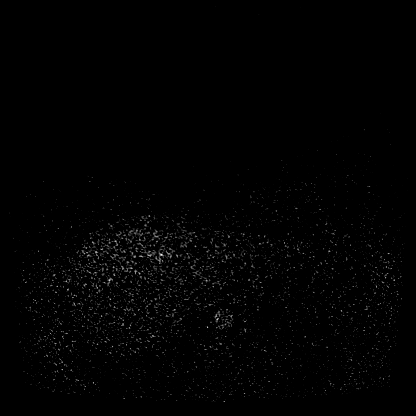
[im 44/176]
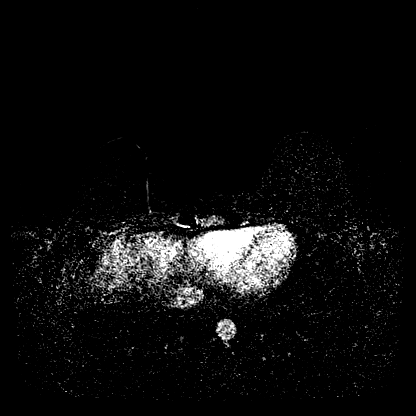
[im 88/176]
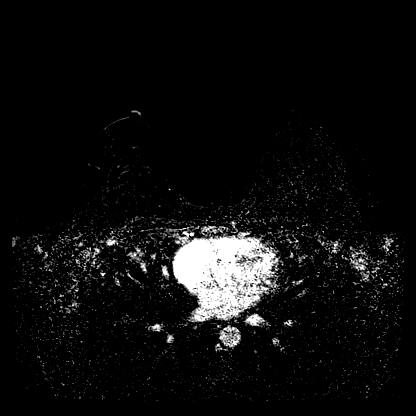
[im 132/176]
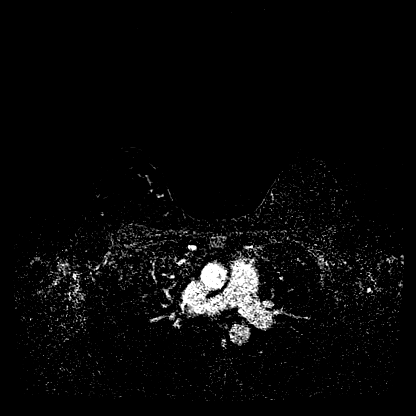
[im 176/176]
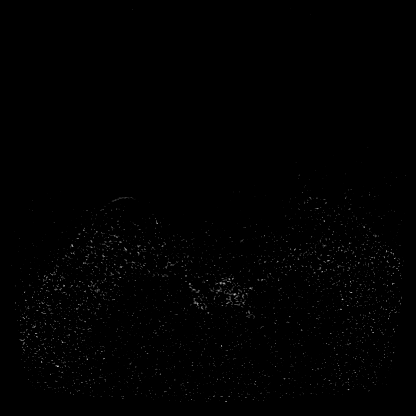

[Series 7: fl3d post-cm 20 · axial · 158.4mm · 0.87mm/px · 1 of 1 slices shown (3 of 3)]
[im 1/1]
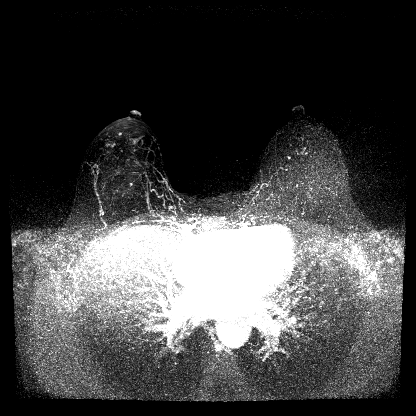

[Series 8: fl3d post-cm 3min · axial · 0.9mm · 0.87mm/px · z∈[-83,+75]mm · 6 of 176 slices shown]
[im 1/176]
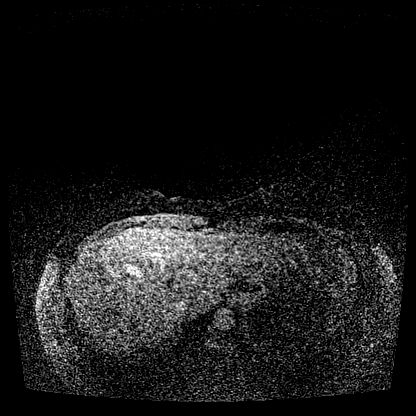
[im 36/176]
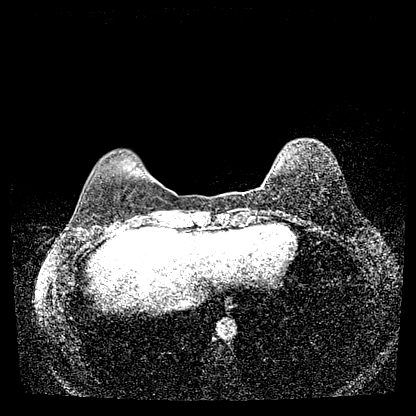
[im 71/176]
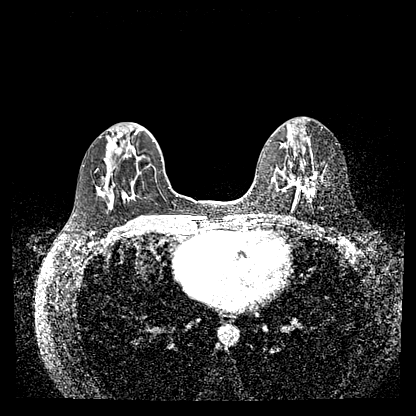
[im 106/176]
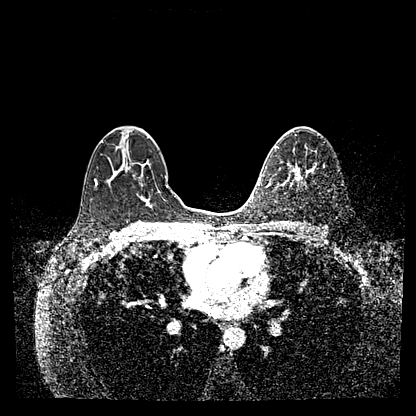
[im 141/176]
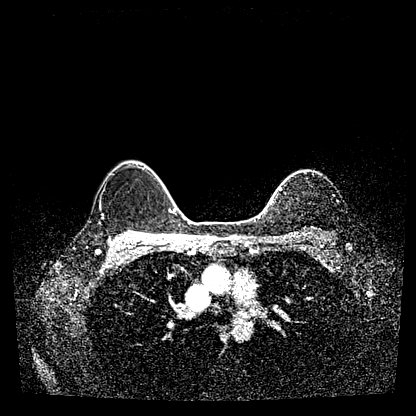
[im 176/176]
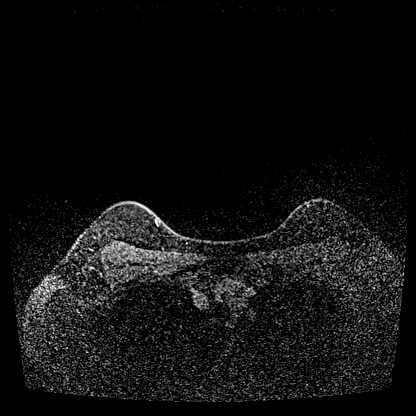

[Series 9: fl3d post-cm 3min_sub · axial · 0.9mm · 0.87mm/px · z∈[-83,-51]mm · 2 of 176 slices shown]
[im 1/176]
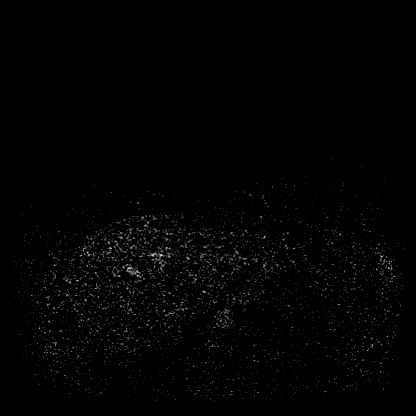
[im 36/176]
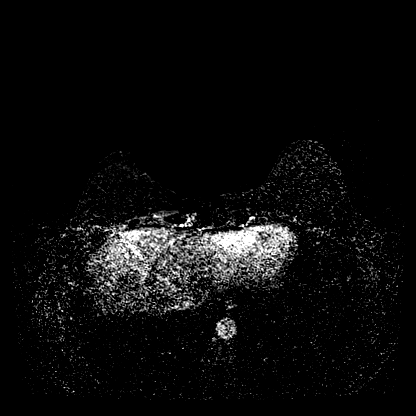

[30 of 48 positions shown; findings below may reference images not displayed]

Three-dimensional MR images were rendered by post-processing of the
original MR data on an independent workstation. The
three-dimensional MR images were interpreted, and findings are
reported in the following complete MRI report for this study. Three
dimensional images were evaluated at the independent [employer]
workstation
FINDINGS: Breast composition: c. Heterogeneous fibroglandular tissue.

Background parenchymal enhancement: Mild to moderate.

Right breast: No dominant mass or suspicious enhancement.

Left breast: No dominant mass or suspicious enhancement.

Lymph nodes: No abnormal appearing lymph nodes.

Ancillary findings:  None.
IMPRESSION: No MRI evidence of malignancy in either breast.

RECOMMENDATION:
Routine annual screening mammography and breast MRI. The patient is
due for her next screening mammogram in [DATE].

BI-RADS CATEGORY  1: Negative.

## 2019-06-21 MED ORDER — GADOBUTROL 1 MMOL/ML IV SOLN
10.0000 mL | Freq: Once | INTRAVENOUS | Status: AC | PRN
  Administered 2019-06-21: 10 mL via INTRAVENOUS

## 2019-06-21 NOTE — Telephone Encounter (Signed)
I informed her that the breast MRI is normal.

## 2019-07-28 ENCOUNTER — Ambulatory Visit: Payer: 59 | Admitting: Podiatry

## 2019-07-28 ENCOUNTER — Ambulatory Visit (INDEPENDENT_AMBULATORY_CARE_PROVIDER_SITE_OTHER): Payer: 59

## 2019-07-28 ENCOUNTER — Encounter: Payer: Self-pay | Admitting: Podiatry

## 2019-07-28 ENCOUNTER — Other Ambulatory Visit: Payer: Self-pay

## 2019-07-28 DIAGNOSIS — M21611 Bunion of right foot: Secondary | ICD-10-CM | POA: Diagnosis not present

## 2019-07-28 DIAGNOSIS — M7751 Other enthesopathy of right foot: Secondary | ICD-10-CM

## 2019-07-28 DIAGNOSIS — M21612 Bunion of left foot: Secondary | ICD-10-CM | POA: Diagnosis not present

## 2019-07-28 DIAGNOSIS — M7752 Other enthesopathy of left foot: Secondary | ICD-10-CM

## 2019-07-28 MED ORDER — MELOXICAM 15 MG PO TABS: 15.0000 mg | ORAL_TABLET | Freq: Every day | ORAL | 1 refills | Status: DC

## 2019-07-31 NOTE — Progress Notes (Signed)
   HPI: 44 y.o. female presenting today as a new patient with a chief complaint of sudden onset soreness and pressure to the 1st MPJs of the bilateral feet that began 4-5 weeks ago. Walking and standing for long periods of time increases the pain. She has been using toe pads, soaking in Epsom salt and applying a topical ointment for treatment. Patient is here for further evaluation and treatment.   Past Medical History:  Diagnosis Date  . Anemia   . Family history of breast cancer   . Family history of ovarian cancer   . Family history of stomach cancer   . Family hx of colon cancer    Mother  . History of Helicobacter pylori infection    WHEN SHE WAS 18     Physical Exam: General: The patient is alert and oriented x3 in no acute distress.  Dermatology: Skin is warm, dry and supple bilateral lower extremities. Negative for open lesions or macerations.  Vascular: Palpable pedal pulses bilaterally. No edema or erythema noted. Capillary refill within normal limits.  Neurological: Epicritic and protective threshold grossly intact bilaterally.   Musculoskeletal Exam: Pain with palpation noted to the 1st MPJs of the bilateral feet. Range of motion within normal limits to all pedal and ankle joints bilateral. Muscle strength 5/5 in all groups bilateral.   Radiographic Exam:  Normal osseous mineralization. Joint spaces preserved. No fracture/dislocation/boney destruction.    Assessment: 1. 1st MPJ capsulitis bilateral    Plan of Care:  1. Patient evaluated. X-Rays reviewed.  2. Prescription for Meloxicam provided to patient. 3. Recommended good shoe gear.  4. Declined injections today.  5. Return to clinic as needed. If not better, we will do injections at next visit.       Felecia Shelling, DPM [facility]  Dr. Felecia Shelling, DPM    [address] [place], [place] [address]                Office [phone]  Fax [phone]

## 2019-09-16 ENCOUNTER — Telehealth: Payer: Self-pay | Admitting: Podiatry

## 2019-09-16 NOTE — Telephone Encounter (Signed)
I spoke with pt and she states she opened the door over the left big toe and the nail is chipped and there is now bruising at the joint and it hurts at night. I told pt that she probably jammed the big toe and now the blood from the bruise is dying and darkening and if she is walking on the foot and not having a lot of pain, what is happening she is irritating the joint and once she starts to rest and not have the day's stimulation and activity the pain comes forward. Pt states that is right. I instructed pt to wear a stiff or thick bottom shoe and take OTC ibuprofen, if no better in a week, call for an appt.

## 2019-09-16 NOTE — Telephone Encounter (Signed)
Pt called stating that she hit her toe 2wks ago and its some bruising and she would like to know if there is something she can take please assist

## 2019-12-09 ENCOUNTER — Ambulatory Visit: Payer: 59 | Admitting: Hematology and Oncology

## 2020-01-02 NOTE — Progress Notes (Signed)
Patient Care Team: Rankins, Bill Salinas, MD as PCP - General (Family Medicine) [name], MD as Consulting Physician (Obstetrics and Gynecology)  DIAGNOSIS:    ICD-10-CM   1. BRCA2 positive  Z15.01    Z15.09     CHIEF COMPLIANT: Follow-up of high risk for breast cancer  INTERVAL HISTORY: [facility] is a 44 y.o. with above-mentioned history of high risk for breast cancer, positive for BRCA2 mutation. She underwent a bilateral salpingo-oophorectomy and hysterectomy on 12/18/18. Breast MRI on 06/21/19 showed no evidence of malignancy bilaterally. She presents to the clinic today for follow-up.  ALLERGIES:  is allergic to hyoscyamine sulfate.  MEDICATIONS:  Current Outpatient Medications  Medication Sig Dispense Refill  . B Complex-C (B-COMPLEX WITH VITAMIN C) tablet Take 1 tablet by mouth daily.    . calcium carbonate (TUMS EX) 750 MG chewable tablet Chew 1 tablet by mouth 2 (two) times daily.    . Cholecalciferol (VITAMIN D) 50 MCG (2000 UT) CAPS Take 4,000 Units by mouth daily.    Marland Kitchen docusate sodium (COLACE) 100 MG capsule Take 100 mg by mouth daily. Taking 2 pills at night    . meloxicam (MOBIC) 15 MG tablet Take 1 tablet (15 mg total) by mouth daily. 30 tablet 1  . vitamin E 400 UNIT capsule Take 400 Units by mouth daily.    . Zinc 15 MG CAPS Take 15 mg by mouth daily.     No current facility-administered medications for this visit.    PHYSICAL EXAMINATION: ECOG PERFORMANCE STATUS: 1 - Symptomatic but completely ambulatory  Vitals:   01/03/20 1546  BP: 108/75  Pulse: 71  Resp: 18  Temp: (!) 97.5 F (36.4 C)  SpO2: 98%   Filed Weights   01/03/20 1546  Weight: 124 lb 8 oz (56.5 kg)    BREAST: No palpable masses or nodules in either right or left breasts. No palpable axillary supraclavicular or infraclavicular adenopathy no breast tenderness or nipple discharge. (exam performed in the presence of a chaperone)  LABORATORY DATA:  I have reviewed the data as  listed CMP Latest Ref Rng & Units 02/10/2013  Glucose 70 - 99 mg/dL 121(H)  BUN 6 - 23 mg/dL 16  Creatinine 0.40 - 1.20 mg/dL 0.5  Sodium 135 - 145 mEq/L 140  Potassium 3.5 - 5.1 mEq/L 4.0  Chloride 96 - 112 mEq/L 106  CO2 19 - 32 mEq/L 26  Calcium 8.4 - 10.5 mg/dL 9.4  Total Protein 6.0 - 8.3 g/dL 7.5  Total Bilirubin 0.3 - 1.2 mg/dL 0.4  Alkaline Phos 39 - 117 U/L 39  AST 0 - 37 U/L 19  ALT 0 - 35 U/L 18    Lab Results  Component Value Date   WBC 12.2 (H) 12/19/2018   HGB 10.1 (L) 12/19/2018   HCT 32.1 (L) 12/19/2018   MCV 90.9 12/19/2018   PLT 339 12/19/2018   NEUTROABS 4.7 05/15/2018    ASSESSMENT & PLAN:  BRCA2 positive 12/18/2018: Status post hysterectomy and bilateral salpingo-oophorectomy Breast cancer surveillance:  Bilateral breast MRI 06/21/2019: Benign Mammogram 03/09/2019: Benign breast density category C  Continue with annual mammograms and breast MRI surveillance. I sent a message to Dr. Fuller Plan with [name] to follow her for pancreatic cancer surveillance.  Currently patient has an estrogen patch.  I discussed with her to keep the duration of estrogen patch as low as possible.  BRCA2 mutation causes increased risk of hormone receptor positive breast cancer.  She will be discussing  this with her gastroenterologist.  Return to clinic annually for follow-up.  I will call her after the breast MRI is available.  No orders of the defined types were placed in this encounter.  The patient has a good understanding of the overall plan. she agrees with it. she will call with any problems that may develop before the next visit here.  Total time spent: 20 mins including face to face time and time spent for planning, charting and coordination of care  Nicholas Lose, MD 01/03/2020  I, Cloyde Reams Dorshimer, am acting as scribe for Dr. Nicholas Lose.  I have reviewed the above documentation for accuracy and completeness, and I agree with the  above.

## 2020-01-03 ENCOUNTER — Inpatient Hospital Stay: Payer: BC Managed Care – PPO | Attending: Hematology and Oncology | Admitting: Hematology and Oncology

## 2020-01-03 ENCOUNTER — Other Ambulatory Visit: Payer: Self-pay

## 2020-01-03 DIAGNOSIS — Z1501 Genetic susceptibility to malignant neoplasm of breast: Secondary | ICD-10-CM | POA: Diagnosis present

## 2020-01-03 DIAGNOSIS — Z79899 Other long term (current) drug therapy: Secondary | ICD-10-CM | POA: Diagnosis not present

## 2020-01-03 DIAGNOSIS — Z90710 Acquired absence of both cervix and uterus: Secondary | ICD-10-CM | POA: Diagnosis not present

## 2020-01-03 DIAGNOSIS — Z1509 Genetic susceptibility to other malignant neoplasm: Secondary | ICD-10-CM | POA: Diagnosis not present

## 2020-01-03 DIAGNOSIS — Z791 Long term (current) use of non-steroidal anti-inflammatories (NSAID): Secondary | ICD-10-CM | POA: Diagnosis not present

## 2020-01-03 DIAGNOSIS — Z90722 Acquired absence of ovaries, bilateral: Secondary | ICD-10-CM | POA: Diagnosis not present

## 2020-01-03 DIAGNOSIS — Z803 Family history of malignant neoplasm of breast: Secondary | ICD-10-CM | POA: Diagnosis not present

## 2020-01-03 NOTE — Assessment & Plan Note (Signed)
12/18/2018: Status post hysterectomy and bilateral salpingo-oophorectomy Breast cancer surveillance:  Bilateral breast MRI 06/21/2019: Benign Mammogram 03/09/2019: Benign breast density category C  Continue with annual mammograms and breast MRI surveillance.

## 2020-01-05 ENCOUNTER — Telehealth: Payer: Self-pay

## 2020-01-05 NOTE — Telephone Encounter (Signed)
-----  Message from Ladene Artist, MD sent at 01/04/2020 11:37 AM EDT ----- Regarding: RE: Panc ca screening Hi, We will contact her and set her up for an office visit. A family history of pancreatic cancer makes it much easier to recommend MRCP and/or EUS screening. She may benefit from a pancreatic screening protocol.  Thanks, Norberto Sorenson  ----- Message ----- From: Nicholas Lose, MD Sent: 01/03/2020   4:05 PM EDT To: Ladene Artist, MD Subject: Panc ca screening                              Dr.Stark, Our mutual patient has a BRCA 2 mutation and I am following her for breast cancer surveilance. I asked her to follow up with you for pancreatic cancer surveillance. She doesn't have any family history of pancreatic cancer. So the recommendations for MRCPs is fuzzy. Can you please see her and talk to her? Thanks Nicholas Lose

## 2020-01-05 NOTE — Telephone Encounter (Signed)
Left message for patient to call and schedule a office visit.

## 2020-01-06 NOTE — Telephone Encounter (Signed)
Left message for patient to schedule an office visit.

## 2020-06-24 ENCOUNTER — Ambulatory Visit
Admission: RE | Admit: 2020-06-24 | Discharge: 2020-06-24 | Disposition: A | Discharge status: Home / Self Care | Payer: BC Managed Care – PPO | Source: Ambulatory Visit | Attending: Hematology and Oncology | Admitting: Hematology and Oncology

## 2020-06-24 ENCOUNTER — Other Ambulatory Visit: Payer: Self-pay

## 2020-06-24 DIAGNOSIS — Z1501 Genetic susceptibility to malignant neoplasm of breast: Secondary | ICD-10-CM

## 2020-06-24 DIAGNOSIS — Z1509 Genetic susceptibility to other malignant neoplasm: Secondary | ICD-10-CM

## 2020-06-24 IMAGING — MR MR BREAST BILAT WO/W CM
8 of 12 series · 34 of 48 positions shown · IV contrast (6 ml gadavist)
Comparison: MRI breast [DATE]

CLINICAL DATA: High risk screening.  BRCA positive.

LABS:  None.
EXAM:
BILATERAL BREAST MRI WITH AND WITHOUT CONTRAST
TECHNIQUE: Multiplanar, multisequence MR images of both breasts were obtained
prior to and following the intravenous administration of 6 ml of
Gadavist

[Series 2: t2_tirm_tra ipat (a-p) · axial · 3.0mm · 0.70mm/px · 1 of 55 slices shown]
[im 1/55]
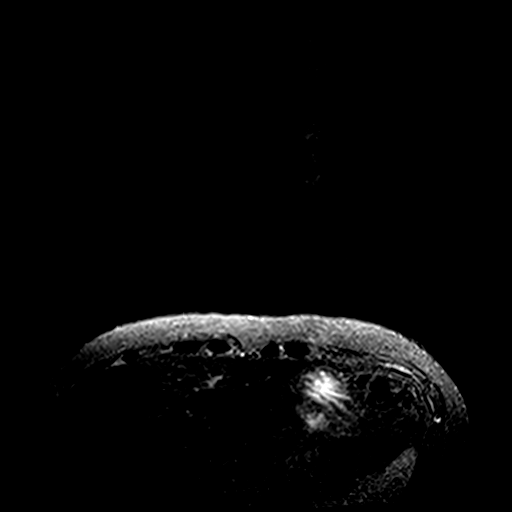

[Series 3: fl3d pre-cm no · axial · non-contrast · 1.2mm · 0.94mm/px · z∈[-86,+85]mm · 5 of 144 slices shown]
[im 1/144]
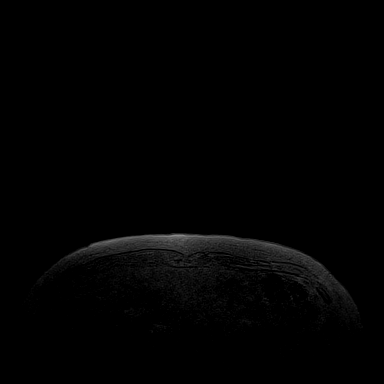
[im 36/144]
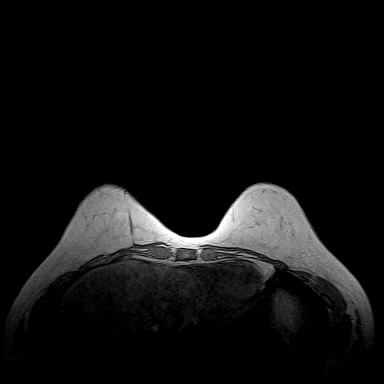
[im 72/144]
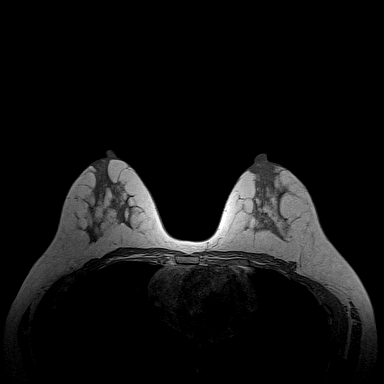
[im 108/144]
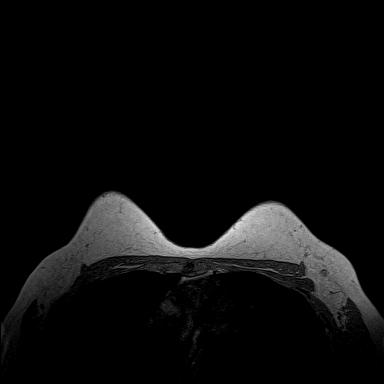
[im 144/144]
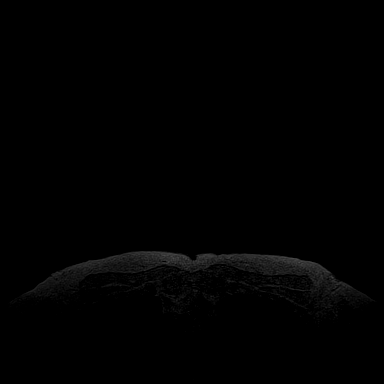

[Series 4: fl3d pre-cm · axial · non-contrast · 1.2mm · 0.94mm/px · z∈[-86,+85]mm · 5 of 144 slices shown]
[im 1/144]
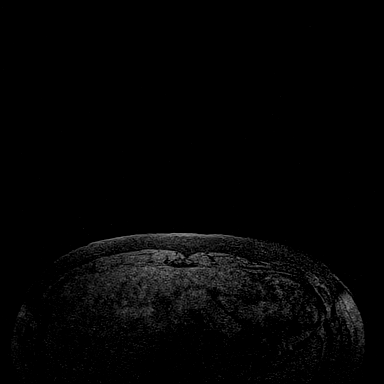
[im 36/144]
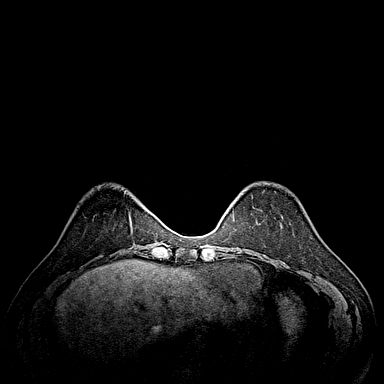
[im 72/144]
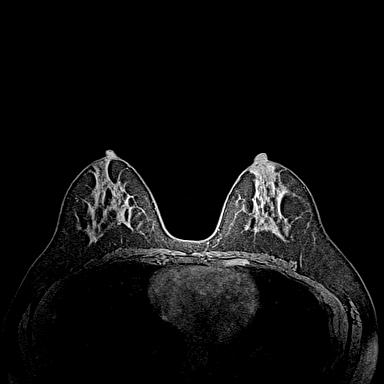
[im 108/144]
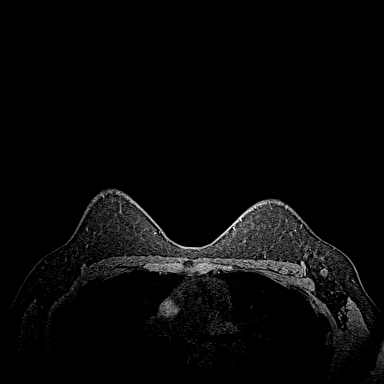
[im 144/144]
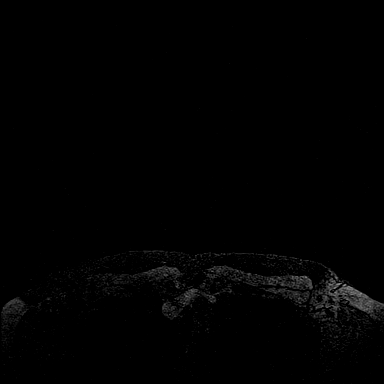

[Series 5: fl3d post-cm 20 · axial · 1.2mm · 0.94mm/px · z∈[-86,+85]mm · 5 of 144 slices shown (1 of 3)]
[im 1/144]
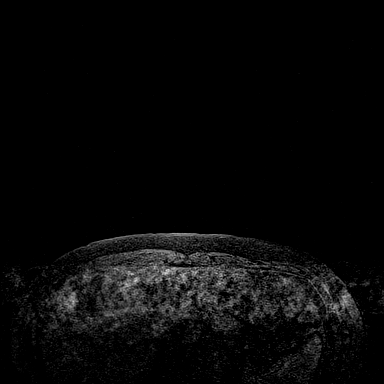
[im 36/144]
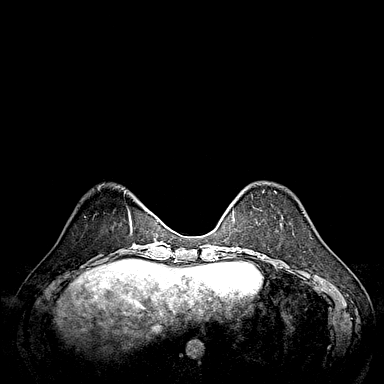
[im 72/144]
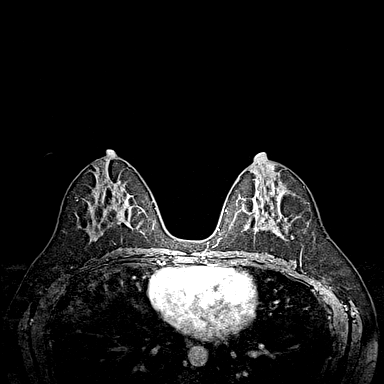
[im 108/144]
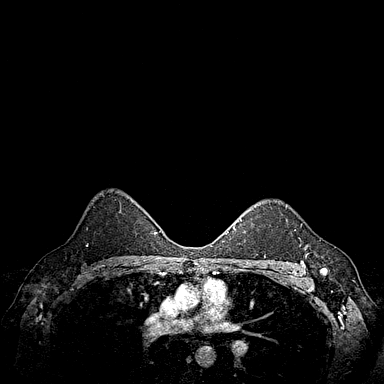
[im 144/144]
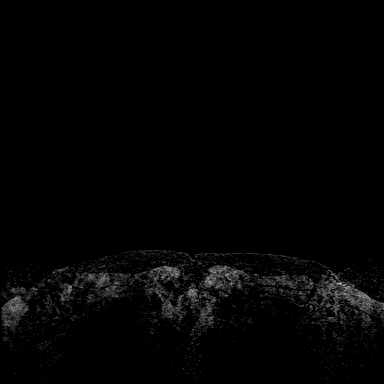

[Series 6: fl3d post-cm 20 · axial · 1.2mm · 0.94mm/px · z∈[-86,+85]mm · 5 of 144 slices shown (2 of 3)]
[im 1/144]
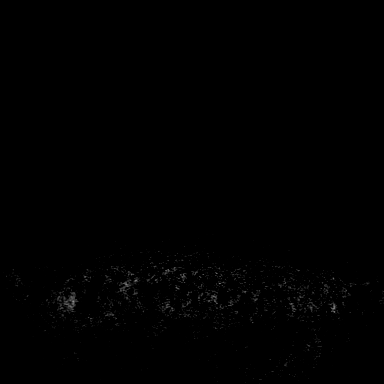
[im 36/144]
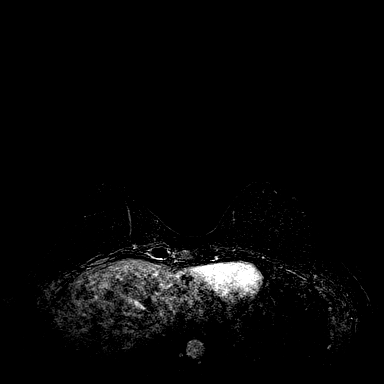
[im 72/144]
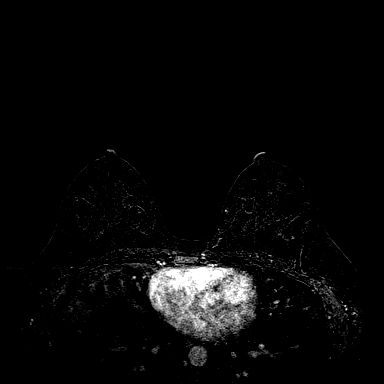
[im 108/144]
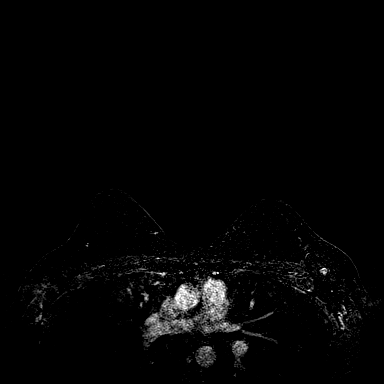
[im 144/144]
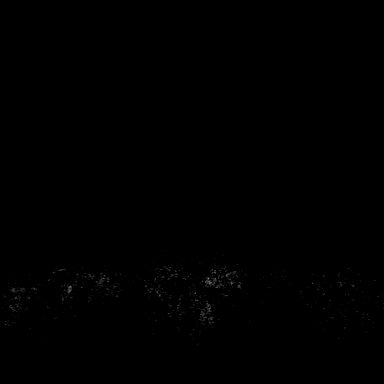

[Series 7: fl3d post-cm 20 · axial · 172.8mm · 0.94mm/px · 1 of 1 slices shown (3 of 3)]
[im 1/1]
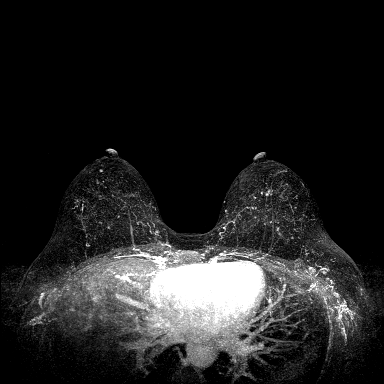

[Series 8: fl3d post-cm 3min · axial · 1.2mm · 0.94mm/px · z∈[-86,+85]mm · 6 of 144 slices shown]
[im 1/144]
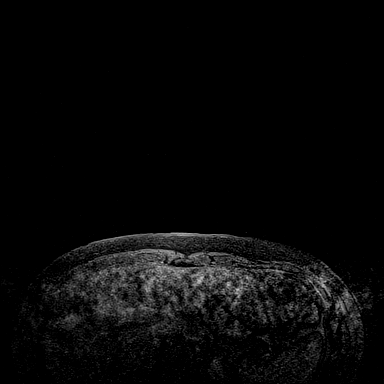
[im 29/144]
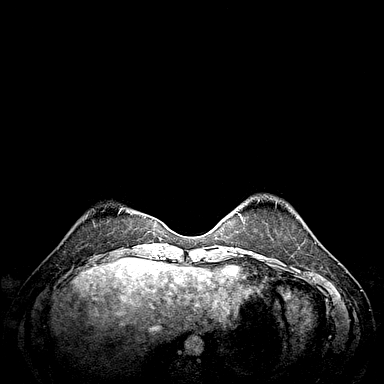
[im 58/144]
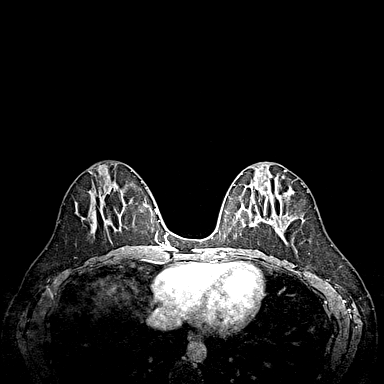
[im 86/144]
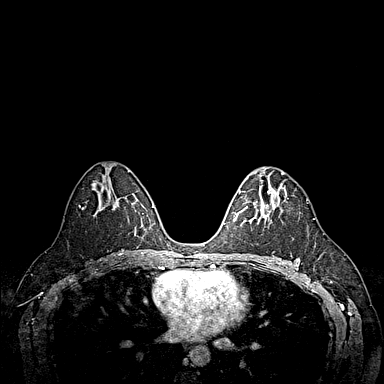
[im 115/144]
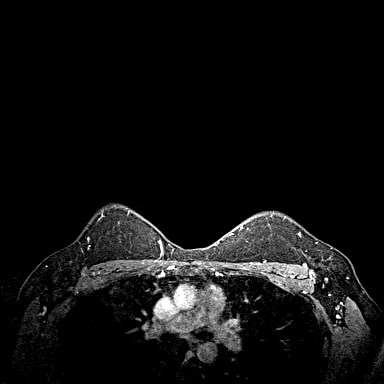
[im 144/144]
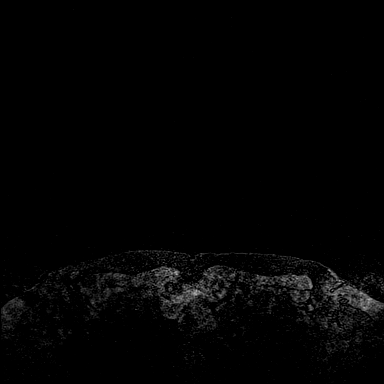

[Series 9: fl3d post-cm 3min_sub · axial · 1.2mm · 0.94mm/px · z∈[-86,+85]mm · 6 of 144 slices shown]
[im 1/144]
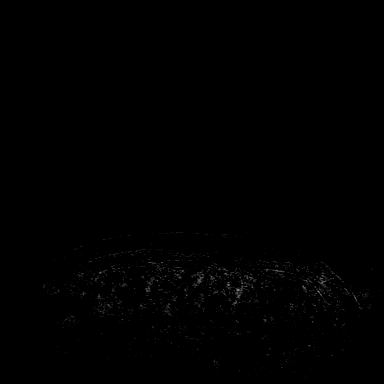
[im 29/144]
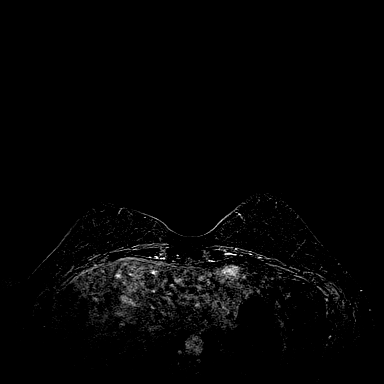
[im 58/144]
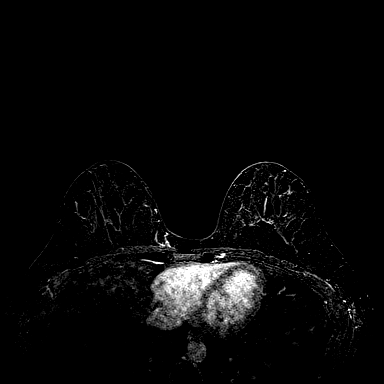
[im 86/144]
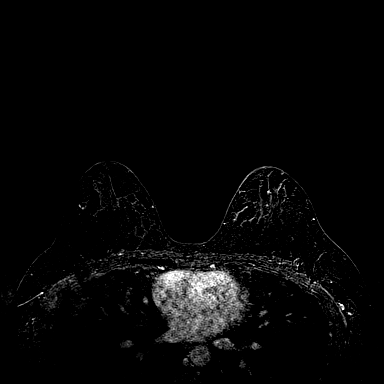
[im 115/144]
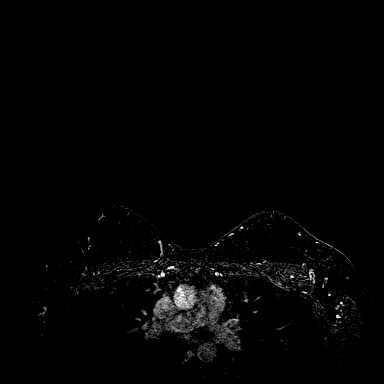
[im 144/144]
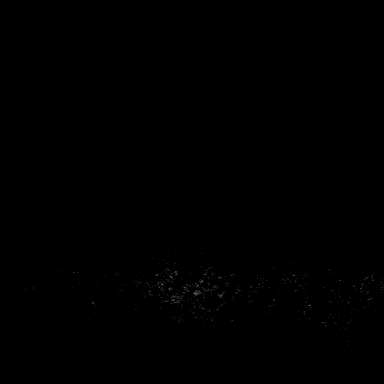

[34 of 48 positions shown; findings below may reference images not displayed]

Three-dimensional MR images were rendered by post-processing of the
original MR data on an independent workstation. The
three-dimensional MR images were interpreted, and findings are
reported in the following complete MRI report for this study. Three
dimensional images were evaluated at the independent interpreting
workstation using the DynaCAD thin client.
FINDINGS: Breast composition: c. Heterogeneous fibroglandular tissue.

Background parenchymal enhancement: Mild

Right breast: No mass or abnormal enhancement.

Left breast: No mass or abnormal enhancement.

Lymph nodes: No abnormal appearing lymph nodes.

Ancillary findings:  None.
IMPRESSION: No MRI evidence of malignancy in either breast.

RECOMMENDATION:
1.  Continue with annual screening mammography.

2.  Continue annual high risk screening breast MRI.

BI-RADS CATEGORY  1: Negative.

## 2020-06-24 MED ORDER — GADOBUTROL 1 MMOL/ML IV SOLN
6.0000 mL | Freq: Once | INTRAVENOUS | Status: AC | PRN
  Administered 2020-06-24: 6 mL via INTRAVENOUS

## 2020-06-29 ENCOUNTER — Ambulatory Visit: Payer: BC Managed Care – PPO | Admitting: Gastroenterology

## 2020-06-29 ENCOUNTER — Encounter: Payer: Self-pay | Admitting: Gastroenterology

## 2020-06-29 ENCOUNTER — Other Ambulatory Visit: Payer: Self-pay

## 2020-06-29 VITALS — BP 90/60 | HR 72 | Ht 63.0 in | Wt 127.0 lb

## 2020-06-29 DIAGNOSIS — Z1501 Genetic susceptibility to malignant neoplasm of breast: Secondary | ICD-10-CM

## 2020-06-29 DIAGNOSIS — Z1211 Encounter for screening for malignant neoplasm of colon: Secondary | ICD-10-CM | POA: Diagnosis not present

## 2020-06-29 DIAGNOSIS — Z1509 Genetic susceptibility to other malignant neoplasm: Secondary | ICD-10-CM | POA: Diagnosis not present

## 2020-06-29 DIAGNOSIS — Z1502 Genetic susceptibility to malignant neoplasm of ovary: Secondary | ICD-10-CM

## 2020-06-29 DIAGNOSIS — Z800 Family history of malignant neoplasm of digestive organs: Secondary | ICD-10-CM | POA: Diagnosis not present

## 2020-06-29 NOTE — Progress Notes (Signed)
History of Present Illness: This is a 45 year old female referred by [place]. Mikey College MD and Rankins, Fanny Dance, MD for the evaluation of BRAC2 mutation.  She is here today to discuss her risk of pancreatic cancer and potential pancreatic cancer screening.  She is high risk for breast cancer and is followed by Dr. Pamelia Hoit. Breat MR on 06/24/2020 was negative. She has a family history of colon cancer in a first degree relative, her mother. Colonoscopy in March 2019 was normal.  She is S/P bilateral salpingo-oophorectomy and hysterectomy in Oct 2020. She has no gastrointestinal complaints. Denies weight loss, abdominal pain, constipation, diarrhea, change in stool caliber, melena, hematochezia, nausea, vomiting, dysphagia, reflux symptoms, chest pain.   Allergies  Allergen Reactions  . Hyoscyamine Sulfate Other (See Comments)    Headaches   Outpatient Medications Prior to Visit  Medication Sig Dispense Refill  . B Complex-C (B-COMPLEX WITH VITAMIN C) tablet Take 1 tablet by mouth daily.    . calcium carbonate (TUMS EX) 750 MG chewable tablet Chew 1 tablet by mouth 2 (two) times daily.    . Cholecalciferol (VITAMIN D) 50 MCG (2000 UT) CAPS Take 4,000 Units by mouth daily.    . ferrous sulfate 325 (65 FE) MG tablet Take 325 mg by mouth daily with breakfast.    . magnesium 30 MG tablet Take 30 mg by mouth daily.    . meloxicam (MOBIC) 15 MG tablet Take 1 tablet (15 mg total) by mouth daily. 30 tablet 1  . Multiple Vitamin (MULTIVITAMIN) tablet Take 1 tablet by mouth daily.    . vitamin E 400 UNIT capsule Take 400 Units by mouth daily.    . Zinc 15 MG CAPS Take 15 mg by mouth daily.    Marland Kitchen docusate sodium (COLACE) 100 MG capsule Take 100 mg by mouth daily. Taking 2 pills at night     No facility-administered medications prior to visit.   Past Medical History:  Diagnosis Date  . Anemia   . Family history of breast cancer   . Family history of ovarian cancer   . Family history of stomach cancer    . Family hx of colon cancer    Mother  . History of Helicobacter pylori infection    WHEN SHE WAS 18   Past Surgical History:  Procedure Laterality Date  . NO PAST SURGERIES    . ROBOTIC ASSISTED TOTAL HYSTERECTOMY WITH BILATERAL SALPINGO OOPHERECTOMY Bilateral 12/18/2018   Procedure: XI ROBOTIC ASSISTED TOTAL HYSTERECTOMY WITH BILATERAL SALPINGO OOPHORECTOMY/Pelvic Washings;  Surgeon: Noland Fordyce, MD;  Location: [facility] [place];  Service: Gynecology;  Laterality: Bilateral;   Social History   Socioeconomic History  . Marital status: Married    Spouse name: Not on file  . Number of children: Not on file  . Years of education: Not on file  . Highest education level: Not on file  Occupational History  . Not on file  Tobacco Use  . Smoking status: Never Smoker  . Smokeless tobacco: Never Used  Vaping Use  . Vaping Use: Never used  Substance and Sexual Activity  . Alcohol use: No  . Drug use: No  . Sexual activity: Not on file  Other Topics Concern  . Not on file  Social History Narrative  . Not on file   Social Determinants of Health   Financial Resource Strain: Not on file  Food Insecurity: Not on file  Transportation Needs: Not on file  Physical Activity: Not on  file  Stress: Not on file  Social Connections: Not on file   Family History  Problem Relation Age of Onset  . Colon cancer Mother 47  . Ovarian cancer Maternal Grandmother 41       d. 3  . Breast cancer Maternal Aunt 45  . Ovarian cancer Cousin        mother's mat first cousin  . Kidney failure Father   . Bone cancer Paternal Grandmother 47  . Ovarian cancer Other        MGMs sister  . Stomach cancer Other        MGMs sister     Review of Systems: Pertinent positive and negative review of systems were noted in the above HPI section. All other review of systems were otherwise negative.   Physical Exam:  General: Well developed, well nourished, no acute distress Head:  Normocephalic and atraumatic Eyes: Sclerae anicteric, EOMI Ears: Normal auditory acuity Psychological:  Alert and cooperative. Normal mood and affect   Assessment and Recommendations:  1. BRAC 2 mutation in a female.  She does not have a first-degree with pancreatic cancer.  In addition she does not have any relative with known pancreatic cancer.  Without a first-degree relative with pancreatic cancer she is not in the high risk group and does not qualify for screening.  If a first-degree relative were to be diagnosed with pancreatic cancer she would be eligible for screening and she is advised to contact my office if that were to occur.  We discussed current pancreatic cancer screening protocols with endoscopic ultrasound alternating with pancreatic MRI.  I addressed her questions to her satisfaction.  2. Family history of colon cancer.  A 5-year interval screening colonoscopy is recommended in March 2024.   cc: Clayborn Heron, MD [address] [place],  [place] [address]

## 2020-06-29 NOTE — Patient Instructions (Signed)
You will be due for a recall colonoscopy in 05/2022. We will send you a reminder in the mail when it gets closer to that time.  Thank you for choosing me and [facility].  Venita Lick. Pleas Koch., MD., Clementeen Graham

## 2020-07-03 ENCOUNTER — Telehealth: Payer: Self-pay | Admitting: Hematology and Oncology

## 2020-07-03 NOTE — Telephone Encounter (Signed)
I informed the patient that breast MRI is normal

## 2021-01-02 ENCOUNTER — Ambulatory Visit: Payer: BC Managed Care – PPO | Admitting: Hematology and Oncology

## 2021-01-08 NOTE — Progress Notes (Signed)
Patient Care Team: Rankins, Bill Salinas, MD as PCP - General (Family Medicine) [name], MD as Consulting Physician (Obstetrics and Gynecology)  DIAGNOSIS:    ICD-10-CM   1. BRCA2 positive  Z15.01    Z15.09      CHIEF COMPLIANT: Follow-up of high risk for breast cancer  INTERVAL HISTORY: Rebekah Sanchez is a 45 y.o. with above-mentioned history of high risk for breast cancer, positive for BRCA2 mutation. Breast MRI on 06/24/2020 showed no evidence of malignancy bilaterally. She presents to the clinic today for follow-up.   ALLERGIES:  is allergic to hyoscyamine sulfate.  MEDICATIONS:  Current Outpatient Medications  Medication Sig Dispense Refill   B Complex-C (B-COMPLEX WITH VITAMIN C) tablet Take 1 tablet by mouth daily.     calcium carbonate (TUMS EX) 750 MG chewable tablet Chew 1 tablet by mouth 2 (two) times daily.     Cholecalciferol (VITAMIN D) 50 MCG (2000 UT) CAPS Take 4,000 Units by mouth daily.     docusate sodium (COLACE) 100 MG capsule Take 100 mg by mouth daily. Taking 2 pills at night     ferrous sulfate 325 (65 FE) MG tablet Take 325 mg by mouth daily with breakfast.     magnesium 30 MG tablet Take 30 mg by mouth daily.     meloxicam (MOBIC) 15 MG tablet Take 1 tablet (15 mg total) by mouth daily. 30 tablet 1   Multiple Vitamin (MULTIVITAMIN) tablet Take 1 tablet by mouth daily.     vitamin E 400 UNIT capsule Take 400 Units by mouth daily.     Zinc 15 MG CAPS Take 15 mg by mouth daily.     No current facility-administered medications for this visit.    PHYSICAL EXAMINATION: ECOG PERFORMANCE STATUS: 1 - Symptomatic but completely ambulatory  There were no vitals filed for this visit. There were no vitals filed for this visit.  BREAST: No palpable masses or nodules in either right or left breasts. No palpable axillary supraclavicular or infraclavicular adenopathy no breast tenderness or nipple discharge. (exam performed in the presence of a  chaperone)  LABORATORY DATA:  I have reviewed the data as listed CMP Latest Ref Rng & Units 02/10/2013  Glucose 70 - 99 mg/dL 121(H)  BUN 6 - 23 mg/dL 16  Creatinine 0.4 - 1.2 mg/dL 0.5  Sodium 135 - 145 mEq/L 140  Potassium 3.5 - 5.1 mEq/L 4.0  Chloride 96 - 112 mEq/L 106  CO2 19 - 32 mEq/L 26  Calcium 8.4 - 10.5 mg/dL 9.4  Total Protein 6.0 - 8.3 g/dL 7.5  Total Bilirubin 0.3 - 1.2 mg/dL 0.4  Alkaline Phos 39 - 117 U/L 39  AST 0 - 37 U/L 19  ALT 0 - 35 U/L 18    Lab Results  Component Value Date   WBC 12.2 (H) 12/19/2018   HGB 10.1 (L) 12/19/2018   HCT 32.1 (L) 12/19/2018   MCV 90.9 12/19/2018   PLT 339 12/19/2018   NEUTROABS 4.7 05/15/2018    ASSESSMENT & PLAN:  BRCA2 positive 12/18/2018: Status post hysterectomy and bilateral salpingo-oophorectomy  Breast cancer surveillance:  06/24/2020: Breast MRI: benign    I will see the patient every other year.  She will get breast MRIs every year in April. She follows with gynecology for breast exams  No orders of the defined types were placed in this encounter.  The patient has a good understanding of the overall plan. she agrees with it. she will call with  any problems that may develop before the next visit here.  Total time spent: 20 mins including face to face time and time spent for planning, charting and coordination of care  Rulon Eisenmenger, MD, MPH 01/09/2021  I, Thana Ates, am acting as scribe for Dr. Nicholas Lose.  I have reviewed the above documentation for accuracy and completeness, and I agree with the above.

## 2021-01-08 NOTE — Assessment & Plan Note (Signed)
12/18/2018: Status post hysterectomy and bilateral salpingo-oophorectomy Breast cancer surveillance:  Bilateral breast MRI 06/21/2019: Benign Mammogram 03/09/2019: Benign breast density category C  Continue with annual mammograms and breast MRI surveillance. I sent a message to Dr. Fuller Plan with [facility] to follow her for pancreatic cancer surveillance.  Currently patient has an estrogen patch.  I discussed with her to keep the duration of estrogen patch as low as possible.  BRCA2 mutation causes increased risk of hormone receptor positive breast cancer.  She will be discussing this with her gastroenterologist.   Breast MRI: benign

## 2021-01-09 ENCOUNTER — Other Ambulatory Visit: Payer: Self-pay

## 2021-01-09 ENCOUNTER — Inpatient Hospital Stay: Payer: BC Managed Care – PPO | Attending: Hematology and Oncology | Admitting: Hematology and Oncology

## 2021-01-09 DIAGNOSIS — Z90710 Acquired absence of both cervix and uterus: Secondary | ICD-10-CM | POA: Diagnosis not present

## 2021-01-09 DIAGNOSIS — Z1501 Genetic susceptibility to malignant neoplasm of breast: Secondary | ICD-10-CM | POA: Insufficient documentation

## 2021-01-09 DIAGNOSIS — Z1239 Encounter for other screening for malignant neoplasm of breast: Secondary | ICD-10-CM

## 2021-01-09 DIAGNOSIS — Z1509 Genetic susceptibility to other malignant neoplasm: Secondary | ICD-10-CM

## 2021-03-13 ENCOUNTER — Telehealth: Payer: Self-pay | Admitting: Hematology and Oncology

## 2021-03-13 NOTE — Telephone Encounter (Signed)
Scheduled appt per 12/28 referral. Pt is aware of appt date and time.  °

## 2021-04-09 NOTE — Progress Notes (Incomplete)
° °  Patient Care Team: Rankins, Bill Salinas, MD as PCP - General (Family Medicine) [name], MD as Consulting Physician (Obstetrics and Gynecology)  DIAGNOSIS: No diagnosis found.  CHIEF COMPLIANT: Follow-up of high risk for breast cancer  INTERVAL HISTORY: Rebekah Sanchez is a 46 y.o. with above-mentioned history of high risk for breast cancer, positive for BRCA2 mutation. She presents to the clinic today for follow-up.   ALLERGIES:  is allergic to hyoscyamine sulfate.  MEDICATIONS:  Current Outpatient Medications  Medication Sig Dispense Refill   B Complex-C (B-COMPLEX WITH VITAMIN C) tablet Take 1 tablet by mouth daily.     calcium carbonate (TUMS EX) 750 MG chewable tablet Chew 1 tablet by mouth 2 (two) times daily.     Cholecalciferol (VITAMIN D) 50 MCG (2000 UT) CAPS Take 4,000 Units by mouth daily.     docusate sodium (COLACE) 100 MG capsule Take 100 mg by mouth daily. Taking 2 pills at night     ferrous sulfate 325 (65 FE) MG tablet Take 325 mg by mouth daily with breakfast.     magnesium 30 MG tablet Take 30 mg by mouth daily.     meloxicam (MOBIC) 15 MG tablet Take 1 tablet (15 mg total) by mouth daily. 30 tablet 1   Multiple Vitamin (MULTIVITAMIN) tablet Take 1 tablet by mouth daily.     vitamin E 400 UNIT capsule Take 400 Units by mouth daily.     Zinc 15 MG CAPS Take 15 mg by mouth daily.     No current facility-administered medications for this visit.    PHYSICAL EXAMINATION: ECOG PERFORMANCE STATUS: {CHL ONC ECOG [id number]}  There were no vitals filed for this visit. There were no vitals filed for this visit.  BREAST:*** No palpable masses or nodules in either right or left breasts. No palpable axillary supraclavicular or infraclavicular adenopathy no breast tenderness or nipple discharge. (exam performed in the presence of a chaperone)  LABORATORY DATA:  I have reviewed the data as listed CMP Latest Ref Rng & Units 02/10/2013  Glucose 70 - 99 mg/dL  121(H)  BUN 6 - 23 mg/dL 16  Creatinine 0.4 - 1.2 mg/dL 0.5  Sodium 135 - 145 mEq/L 140  Potassium 3.5 - 5.1 mEq/L 4.0  Chloride 96 - 112 mEq/L 106  CO2 19 - 32 mEq/L 26  Calcium 8.4 - 10.5 mg/dL 9.4  Total Protein 6.0 - 8.3 g/dL 7.5  Total Bilirubin 0.3 - 1.2 mg/dL 0.4  Alkaline Phos 39 - 117 U/L 39  AST 0 - 37 U/L 19  ALT 0 - 35 U/L 18    Lab Results  Component Value Date   WBC 12.2 (H) 12/19/2018   HGB 10.1 (L) 12/19/2018   HCT 32.1 (L) 12/19/2018   MCV 90.9 12/19/2018   PLT 339 12/19/2018   NEUTROABS 4.7 05/15/2018    ASSESSMENT & PLAN:  No problem-specific Assessment & Plan notes found for this encounter.    No orders of the defined types were placed in this encounter.  The patient has a good understanding of the overall plan. she agrees with it. she will call with any problems that may develop before the next visit here.  Total time spent: *** mins including face to face time and time spent for planning, charting and coordination of care  Rulon Eisenmenger, MD, MPH 04/09/2021  I, Thana Ates, am acting as scribe for Dr. Nicholas Lose.  {insert scribe attestation}

## 2021-04-10 ENCOUNTER — Inpatient Hospital Stay: Payer: BC Managed Care – PPO | Attending: Hematology and Oncology | Admitting: Hematology and Oncology

## 2021-04-10 NOTE — Assessment & Plan Note (Deleted)
12/18/2018: Status post hysterectomy and bilateral salpingo-oophorectomy  Breast cancer surveillance:  06/24/2020: Breast MRI: benign  Mammogram:   Return to clinic in 1 year for follow-up She follows with gynecology for breast exams

## 2021-04-16 ENCOUNTER — Other Ambulatory Visit: Payer: Self-pay | Admitting: Obstetrics

## 2021-04-16 ENCOUNTER — Other Ambulatory Visit: Payer: Self-pay | Admitting: Hematology and Oncology

## 2021-04-16 DIAGNOSIS — Z1509 Genetic susceptibility to other malignant neoplasm: Secondary | ICD-10-CM

## 2021-04-16 DIAGNOSIS — Z1501 Genetic susceptibility to malignant neoplasm of breast: Secondary | ICD-10-CM

## 2021-06-04 ENCOUNTER — Ambulatory Visit (INDEPENDENT_AMBULATORY_CARE_PROVIDER_SITE_OTHER): Payer: BC Managed Care – PPO | Admitting: Podiatry

## 2021-06-04 ENCOUNTER — Ambulatory Visit (INDEPENDENT_AMBULATORY_CARE_PROVIDER_SITE_OTHER): Payer: BC Managed Care – PPO

## 2021-06-04 ENCOUNTER — Other Ambulatory Visit: Payer: Self-pay

## 2021-06-04 DIAGNOSIS — M7751 Other enthesopathy of right foot: Secondary | ICD-10-CM

## 2021-06-04 DIAGNOSIS — M7752 Other enthesopathy of left foot: Secondary | ICD-10-CM

## 2021-06-04 DIAGNOSIS — R52 Pain, unspecified: Secondary | ICD-10-CM | POA: Diagnosis not present

## 2021-06-04 MED ORDER — MELOXICAM 15 MG PO TABS: 15.0000 mg | ORAL_TABLET | Freq: Every day | ORAL | 1 refills | Status: AC

## 2021-06-04 MED ORDER — BETAMETHASONE SOD PHOS & ACET 6 (3-3) MG/ML IJ SUSP
3.0000 mg | Freq: Once | INTRAMUSCULAR | Status: AC
  Administered 2021-06-04: 3 mg via INTRA_ARTICULAR

## 2021-06-04 NOTE — Progress Notes (Signed)
? ?  HPI: 46 y.o. female presenting today for new onset of pain to the bilateral great toes.  She was last seen in the office about 2 years ago.  She says that in the past meloxicam is helped significantly.  She says that over the last few weeks she has had increased pain and tenderness to the bilateral great toe joints.  She has been trying to exercise and she wears Nike shoes.  She presents for further treatment and evaluation.  Currently she has not done anything for treatment ? ?Past Medical History:  ?Diagnosis Date  ? Anemia   ? Family history of breast cancer   ? Family history of ovarian cancer   ? Family history of stomach cancer   ? Family hx of colon cancer   ? Mother  ? History of Helicobacter pylori infection   ? WHEN SHE WAS 18  ? ? ?Past Surgical History:  ?Procedure Laterality Date  ? NO PAST SURGERIES    ? ROBOTIC ASSISTED TOTAL HYSTERECTOMY WITH BILATERAL SALPINGO OOPHERECTOMY Bilateral 12/18/2018  ? Procedure: XI ROBOTIC ASSISTED TOTAL HYSTERECTOMY WITH BILATERAL SALPINGO OOPHORECTOMY/Pelvic Washings;  Surgeon: Noland Fordyce, MD;  Location: [facility] ;  Service: Gynecology;  Laterality: Bilateral;  ? ? ?Allergies  ?Allergen Reactions  ? Hyoscyamine Sulfate Other (See Comments)  ?  Headaches  ? ?  ?Physical Exam: ?General: The patient is alert and oriented x3 in no acute distress. ? ?Dermatology: Skin is warm, dry and supple bilateral lower extremities. Negative for open lesions or macerations. ? ?Vascular: Palpable pedal pulses bilaterally. Capillary refill within normal limits.  Negative for any significant edema or erythema ? ?Neurological: Light touch and protective threshold grossly intact ? ?Musculoskeletal Exam: No pedal deformities noted.  Pain on palpation to the first MTP bilateral ? ?Radiographic Exam:  ?Normal osseous mineralization. Joint spaces preserved. No fracture/dislocation/boney destruction.   ? ?Assessment: ?1.  First MTP capsulitis bilateral ? ? ?Plan of Care:   ?1. Patient evaluated. X-Rays reviewed.  ?2.  Injection of 0.5 cc Celestone Soluspan injected in the bilateral first MTP ?3.  Prescription for meloxicam 15 mg daily ?4.  Recommend wide fitting shoes that do not constrict the toebox area ?5.  Return to clinic as needed ? ?  ?  ?Felecia Shelling, DPM ?[facility] ? ?Dr. Felecia Shelling, DPM  ?  ?[address]                                        ?[place], [place] [address]                ?Office [phone]  ?Fax [phone] ? ? ? ? ?

## 2021-06-04 NOTE — Progress Notes (Signed)
Formatting of this note is different from the original.  Images from the original note were not included.      HPI: 46 y.o. female presenting today for new onset of pain to the bilateral great toes.  She was last seen in the office about 2 years ago.  She says that in the past meloxicam is helped significantly.  She says that over the last few weeks she has had increased pain and tenderness to the bilateral great toe joints.  She has been trying to exercise and she wears Nike shoes.  She presents for further treatment and evaluation.  Currently she has not done anything for treatment    Past Medical History:   Diagnosis Date    Anemia     Family history of breast cancer     Family history of ovarian cancer     Family history of stomach cancer     Family hx of colon cancer     Mother    History of Helicobacter pylori infection     WHEN SHE WAS 18     Past Surgical History:   Procedure Laterality Date    NO PAST SURGERIES      ROBOTIC ASSISTED TOTAL HYSTERECTOMY WITH BILATERAL SALPINGO OOPHERECTOMY Bilateral 12/18/2018    Procedure: XI ROBOTIC ASSISTED TOTAL HYSTERECTOMY WITH BILATERAL SALPINGO OOPHORECTOMY/Pelvic Washings;  Surgeon: [name], MD;  Location: [facility];  Service: Gynecology;  Laterality: Bilateral;     Allergies   Allergen Reactions    Hyoscyamine Sulfate Other (See Comments)     Headaches       Physical Exam:  General: The patient is alert and oriented x3 in no acute distress.    Dermatology: Skin is warm, dry and supple bilateral lower extremities. Negative for open lesions or macerations.    Vascular: Palpable pedal pulses bilaterally. Capillary refill within normal limits.  Negative for any significant edema or erythema    Neurological: Light touch and protective threshold grossly intact    Musculoskeletal Exam: No pedal deformities noted.  Pain on palpation to the first MTP bilateral    Radiographic Exam:   Normal osseous mineralization. Joint spaces preserved. No  fracture/dislocation/boney destruction.      Assessment:  1.  First MTP capsulitis bilateral    Plan of Care:   1. Patient evaluated. X-Rays reviewed.   2.  Injection of 0.5 cc Celestone Soluspan injected in the bilateral first MTP  3.  Prescription for meloxicam 15 mg daily  4.  Recommend wide fitting shoes that do not constrict the toebox area  5.  Return to clinic as needed        Edrick Kins, DPM  [facility]    Dr. Edrick Kins, DPM     [address] [place] [address]                 Office [phone]   Fax [phone]      Electronically signed by Edrick Kins, DPM at 06/04/2021  4:19 PM EDT

## 2021-08-14 ENCOUNTER — Ambulatory Visit: Payer: BC Managed Care – PPO

## 2021-10-02 ENCOUNTER — Encounter: Payer: Self-pay | Admitting: Hematology and Oncology

## 2022-02-22 ENCOUNTER — Encounter: Payer: PRIVATE HEALTH INSURANCE | Attending: Family Medicine

## 2023-01-13 ENCOUNTER — Ambulatory Visit: Payer: BC Managed Care – PPO | Admitting: Hematology and Oncology

## 2023-05-21 NOTE — Progress Notes (Signed)
 Formatting of this note is different from the original.  [facility]  [address] [place] [address]  Phone: [phone]  Fax: [phone]    Name: Linda King MRN: [id number]   DOB: 1975-10-28 PCP: Cole Suzen Bottcher, MD    Provider: Kayla Scot Mana, MD   Referring Provider        Subjective   Chief Complaint:   Chief Complaint   Patient presents with    Gastrophageal Reflux     HPI:  Linda King is a 48 year old female who is here for GERD.  Pt was last seen in our clinic in 04/2022 for FHx colon cancer (mother at age of 28s) and patient's history of BRCA2 gene mutation.  During the interim, she underwent colonoscopy on 04/26/22 which showed:  Findings:   - The digital rectal exam was normal  - Small Grade I hemorrhoids without bleeding were found during retroflexion.  - The terminal ileum appeared normal  - Otherwise, normal remainder examined colon with no gross inflammation or mass lesion.    She was asked to repeat Colonoscopy in 5 years.    She reports onset of sore throat in 12/2022 after husband having COVID where she was tested negative.  She has the urge of swallowing saliva and slight upset in the stomach only after taking iron supplement and MVI.  She was evaluated by PCP and had unremarkable thyroid U/S in 12/2022.  She was placed on Omeprazole in 12/2022 and then Vonoprazan in 02/2023, neither helping her symptoms.  Patient denies heartburn, N/V, change of appetite, weight loss, hematemesis, dysphagia, odynophagia, frank abdominal pain, constipation, diarrhea, BRBPR or melena. She reports having regular BM once daily with normal caliber and normal appearing stool while eating a balanced diet.    Patient denies routine use of NSAIDs.  Patient denies tobacco or alcohol use.  Pt's distant relative (2nd cousin) had stomach cancer.      Prior colonoscopy was normal in 2019 in [place].    Allergies   Hyoscyamine sulfate    Current Medication    Outpatient Medications Marked as Taking for the 05/21/23 encounter (Office Visit) with Mana Kayla Scot, MD   Medication Sig Dispense Refill    Vonoprazan Fumarate (VOQUEZNA) 20 MG TABS Take 20 mg by mouth daily. 90 tablet 1    Ferrous Sulfate (IRON PO) Take  by mouth.      Soft Lens Products (BIOTRUE) SOLN by Does Not Apply route.      estradiol 0.1 MG/24HR PTTW Apply 1 patch topically 2 (two) times a week.      Multiple Vitamin (MULTIVITAMIN) TABS Take 1 tablet by mouth daily.       Past Medical History   Past Medical History:   Diagnosis Date    BRCA2 positive     Iron deficiency anemia     Migraine      Past Surgical History  Past Surgical History:   Procedure Laterality Date    BILATERAL SALPINGOOPHORECTOMY      COLONOSCOPY  2019    COLONOSCOPY N/A 04/26/2022    Procedure: MAC COLONOSCOPY POSSIBLE BIOPSY AND/OR POLYPECTOMY;  Surgeon: Mana Kayla Scot, MD;  Location: [facility];  Service: Gastroenterology;  Laterality: N/A;  MAC COLONOSCOPY     HYSTERECTOMY      HYSTERECTOMY LAPAROSCOPIC TOTAL      OOPHORECTOMY         Family History/Social History    FHx colon cancer (mother  at age of 39s) and Pt's distant relative (2nd cousin) had stomach cancer.  No Tobacco, EtOH, illicit drugs.    ROS:  No fever, chills, or sweats.  Normal appetite and weight.  No HA, visual disturbance, eye pain, jaundice, sore throat or mouth ulcers.  No skin rash or itching.  No chest pain, shortness of breath, dyspnea on exertion, cough or wheeze.  No urinary frequency, urgency, hematuria, or dysuria.  No myalgia, arthralgia, or joint swelling.  No weakness, numbness, or confusion.  GI per HPI.  No polyuria, polydipsia, heat or cold intolerance.    PE: VS:  BP 103/69   Pulse 71   Ht 63 (160 cm)   Wt 137 lb 6.4 oz (62.3 kg)   BMI 24.34 kg/m  Body mass index is 24.34 kg/m.    GENERAL:  Pleasant and NAD.  HEENT: NCAT, PERRLA, EOMI, Scleral anicteric.  Oropharynx clear with no erythema or exudate.  Neck supple, no cervical  LAD or thyromegaly.  CV: RRR, NL S1/S2, no murmurs.  Distal pulses palpable and equal b/l.  LUNGS: CTA b/l.   No W/R/R.  Abdomen: + BS, soft, non-tender and non-distended. No hepatosplenomegaly. No mass felt.  No rebound or guarding.  No hernia.  Extremities: No C/C/E. No muscle atrophies.  Lymph: No cervical or supraclavicular LAD.   Neurologic: A&O x 3, CN II-XII grossly intact.  DTR +2 symmetric in patella. Normal cerebellar function. Normal gait.  Non-focal.    Labs and Imaging:  Recent blood work and relevant radiologic and endoscopic studies were reviewed and discussed with the patient.  WBC (x10(3)/mcL)   Date Value   08/22/2022 6.9     HEMOGLOBIN (g/dL)   Date Value   93/86/7975 12.7     HEMATOCRIT (%)   Date Value   08/22/2022 39.0     PLATELET (x10(3)/mcL)   Date Value   08/22/2022 372 (H)     Lab Results   Component Value Date    NA 139 08/22/2022    K 4.1 08/22/2022    CL 105 08/22/2022    CO2 25 08/22/2022    AGAP 9 08/22/2022    GLU 84 08/22/2022    BUN 16 08/22/2022    CRET 0.80 08/22/2022     TOTAL PROTEIN (gm/dL)   Date Value   93/86/7975 7.5     ALBUMIN (gm/dL)   Date Value   93/86/7975 4.6     TOTAL BILIRUBIN (mg/dL)   Date Value   93/86/7975 0.5     AST (U/L)   Date Value   08/22/2022 18     ALT (U/L)   Date Value   08/22/2022 14     ALK PHOSPHATASE (U/L)   Date Value   08/22/2022 57     Thyroid U/S on 01/02/23 showed:  Impression     Diffusely heterogeneous thyroid gland without suspicious thyroid nodule.     RECOMMENDATION:   No FNA biopsy or imaging followup recommended       ASSESSMENT/PLAN:  1. Dyspepsia    2. FH: stomach cancer    3. BRCA2 positive    4. Family history of colon cancer      Note:  Pt whose distant relative (2nd cousin) had stomach cancer c/o minimal dyspepsia after taking iron pill and certain food since 12/2022, along with increased urge to swallow saliva.  Trial of Omeprazole and Vonoprazan (K-CAB) did not help her symptoms.  No other GI symptoms.  She has been taking iron  since teenage without stomach issues.  She also has tried antihistamine for allergies, which did not help her symptoms.  She has FHx colon cancer (mother) and BRCA2 gene mutation, which place her at increased risk for colorectal cancer.  She had normal colonoscopies in 04/2022 and 2019.      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  Given her minimal symptom, I do not think standard treatment such as low dose TCA is warranted as risk of side effects outweighs benefits.  However, given her FH stomach cancer and her BRCA2 gene mutation, I suggest that she obtain EGD to r/o neoplastic process.  She was also reminded to repeat colonoscopy in 04/2027    Plan:  1.  Obtain EGD in the near future to r/o neoplastic process  2.  Obtain repeat screening colonoscopy in 04/2027.    Orders Placed This Encounter    UPPER GI ENDOSCOPY,EXAM       FOLLOW-UP: Return to our clinic on an as needed basis and follow with her physician as previously scheduled    Cc:  Jinx Nicolette Jansky, MD  Electronically signed by Laurita Kayla Boer, MD at 05/21/2023  9:09 AM EDT

## 2023-07-23 NOTE — Progress Notes (Signed)
 Annual Exam    Chief Complaint   Patient presents with   . Gynecologic Exam       History of Present Illness  Linda King is a 48 year old female who presents for a well woman exam.    She is post-hysterectomy and BSO and on hormone replacement therapy with Vivelle-Dot 0.1 mg, which has improved her energy levels and resolved previous symptoms of hair thinning and low energy. She plans to continue this dose.    As a BRCA2 carrier, she alternates between mammograms and breast MRIs for screening, with the last mammogram in March. She has opted against prophylactic mastectomy due to concerns about surgery and implants. She is monitored by a breast specialist at [facility].    She reports no vaginal dryness or pain and no urinary issues such as leaking or urgency. Her sleep has improved, and she no longer experiences hot flashes or night sweats.    GynHx:  Hysterectomy     OBHx:   OB History       Gravida   3    Para   2    Term   2    Preterm        AB   1    Living   2         SAB   1    IAB        Ectopic        Multiple        Live Births   2                  Past Medical History:   Diagnosis Date   . History of Papanicolaou smear of cervix 2023    neg/neg per pt   . Hx of colonoscopy 04/2022    neg per pt   . Hx of mammogram 05/2023    neg         Past Surgical History:   Procedure Laterality Date   . HX HYSTERECTOMY, TOTAL ABDOMINAL  2020        Social History     Occupational History   . Not on file   Tobacco Use   . Smoking status: Never   . Smokeless tobacco: Never   Substance and Sexual Activity   . Alcohol use: Yes   . Drug use: Never   . Sexual activity: Yes     Partners: Male     Birth control/protection: Surgical     Comment: hysterectomy       Family History   Problem Relation Name Age of Onset   . Colon Cancer Mother     . No Known Problems Father     . No Known Problems Sister     . High Blood Pressure Brother     . No Known Problems Brother     . Ovarian Cancer Maternal Grandmother     . No Known  Problems Maternal Grandfather     . No Known Problems Paternal Grandmother     . No Known Problems Paternal Grandfather           Current Outpatient Medications on File Prior to Visit   Medication Sig Dispense Refill   . Cholecalciferol, Vitamin D3, 50 mcg (2,000 unit) Capsule Take 4,000 Units by mouth.     . docusate sodium (COLACE) 100 mg capsule Take 100 mg by mouth. (Patient not taking: Reported on 07/23/2023)     . ferrous sulfate (  65 MG IRON) 325 mg (65 mg iron) tablet Take 325 mg by mouth daily (with breakfast).     . Multivitamin Tablet Take 1 Tablet by mouth daily.     . Voquezna 20 mg Tablet Take 1 Tablet by mouth daily. (Patient not taking: Reported on 07/23/2023)       No current facility-administered medications on file prior to visit.         Allergies   Allergen Reactions   . Hyoscyamine Sulfate Other (See Comments)     Headaches        Physical Exam:    Vitals:    07/23/23 0826   BP: 104/66   Weight: 133 lb (60.3 kg)   Height: 5' 3 (1.6 m)        Gen: NAD, well appearing   Abd: soft, non tender, non distended, no organomegaly  Breast: normal appearing bilateral breasts without skin changes.  Non tender, no masses palpable   Pelvic exam:   Normal external genitalia, no lesions or masses noted, non-tender  Normal appearing urethra, no masses noted  Normal perineum, no lesions, non-tender  Normal vagina, normal vaginal mucosa, no lesions, normal physiologic discharge, normal vaginal cuff   Uterus and cervix surgically absent   Normal bilateral adnexa, no masses or tenderness noted      Assessment & Plan  Well woman exam  - Normal breast and pelvic exam today  - no further paps  -UTD on colonoscopy    Menopausal syndrome  On Vivelle-Dot 0.1 mg/24 hr patch semiweekly. Reports improvement in energy, hair thinning, and sleep quality. No symptoms of vaginal dryness, urinary issues, or menopausal symptoms. Current dose appropriate.  - Continue Vivelle-Dot 0.1 mg/24 hr patch semiweekly.      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 Opted against prophylactic mastectomy for now. Undergoes regular surveillance with MRIs and mammograms. Annual consultations with breast specialist at [facility].  S/p bilateral oophorectomy    Dispo  - RTO in one year    The patient/family provided verbal consent to the use of ambient listening technology/audio recording during this visit. I reviewed/edited the note before signing.    Emily L. Lenward, MD  07/23/2023  8:44 AM

## 2023-07-29 NOTE — Telephone Encounter (Signed)
 Formatting of this note might be different from the original.  Patient calling in to cancel upcoming procedure.    Reason for cancel: spouse is not able to drive her; patient will check back with him and call to schedule a new date.     SAF sent to facility to cancel.    Electronically signed by Jenise Comer at 07/29/2023 12:20 PM EDT

## 2023-11-11 ENCOUNTER — Ambulatory Visit
Admit: 2023-11-11 | Discharge: 2023-11-11 | Payer: PRIVATE HEALTH INSURANCE | Attending: Family Medicine | Primary: Family Medicine

## 2023-11-11 NOTE — Assessment & Plan Note (Addendum)
{  A/P Summary:[phone]}    Orders:    Ferritin; Future    Iron  and TIBC; Future

## 2023-11-11 NOTE — Assessment & Plan Note (Addendum)
{  A/P Summary:[phone]}    Orders:    TSH reflex to FT4,FT3 ([place] Only); Future

## 2023-11-11 NOTE — Progress Notes (Signed)
 11/11/2023    Linda King (DOB:  11/22/1975) is a 48 y.o. female, here for a preventive medicine evaluation.    Subjective   Patient Active Problem List   Diagnosis    BRCA2 genetic carrier    Hx of migraine headaches    Iron deficiency anemia    Elevated TSH    Multiple food allergies     BRCA2 carrier, has had a total hysterectomy with bilateral salpingo-oophorectomy and on hormone replacement therapy with gynecology.    She alternates between mammograms and breast MRIs for screening    Had colo and sees GI and colonoscopy was normal     Gets very tchy and belly pain and throat sore if she eats certain fruits or veggies    Has seen ortho for upper back pain     Migraines history and pretty well controlled  - triggers environmental rain or in sun too much and food additives, low protein   - aura Linda see squiggly lines in vision and lose peripheral vision and about 20 min later Linda get migraine with light and sound sensitivity     History of IDA and has been inconsistent with her iron supplementation because it does hurt her stomach.    Review of Systems   All other systems reviewed and are negative.      Prior to Visit Medications   Medication Sig Taking? Authorizing Provider   estradiol (VIVELLE) 0.1 MG/24HR APPLY 1 PATCH TO SKIN 2 TIMES WEEKLY. Yes [provider]   Potassium 99 MG TABS Take by mouth Yes [provider]   Magnesium 400 MG CAPS Take by mouth Yes [provider]   Omega-3 Fatty Acids (OMEGA 3 PO) Take 1,250 mg by mouth Yes [provider]   Coenzyme Q10 (CO Q 10 PO) Take by mouth Yes [provider]   BIOTIN PO Take 1,500 mg by mouth Yes [provider]   Ferrous Sulfate (IRON PO) Take 65 mg by mouth Yes [provider]   Zinc 30 MG TABS Take by mouth Yes [provider]        Allergies   Allergen Reactions    Other Hives     Quinoa        No past medical history on file.    Past Surgical History:   Procedure  Laterality Date    HYSTERECTOMY, VAGINAL  12/18/2018       Social History     Socioeconomic History    Marital status: Married     Spouse name: Not on file    Number of children: Not on file    Years of education: Not on file    Highest education level: Not on file   Occupational History    Not on file   Tobacco Use    Smoking status: Never    Smokeless tobacco: Never   Vaping Use    Vaping status: Never Used   Substance and Sexual Activity    Alcohol use: Yes     Alcohol/week: 1.0 standard drink of alcohol     Types: 1 Glasses of wine per week     Comment: twice a month    Drug use: Never    Sexual activity: Not on file   Other Topics Concern    Not on file   Social History Narrative    Not on file     Social Drivers of Health     Financial Resource Strain: Not on  file   Food Insecurity: No Food Insecurity (11/11/2023)    Hunger Vital Sign     Worried About Running Out of Food in the Last Year: Never true     Ran Out of Food in the Last Year: Never true   Transportation Needs: No Transportation Needs (11/11/2023)    PRAPARE - Therapist, art (Medical): No     Lack of Transportation (Non-Medical): No   Physical Activity: Insufficiently Active (11/08/2023)    Exercise Vital Sign     Days of Exercise per Week: 3 days     Minutes of Exercise per Session: 30 min   Stress: Not on file   Social Connections: Not on file   Intimate Partner Violence: Unknown (03/28/2022)    Received from [facility] [employer]     Feel physically or emotionally unsafe where currently live: Not on file     Harm by anyone: Not on file     Emotionally Harmed: Not on file   Housing Stability: Low Risk  (11/11/2023)    Housing Stability Vital Sign     Unable to Pay for Housing in the Last Year: No     Number of Times Moved in the Last Year: 0     Homeless in the Last Year: No        Family History   Problem Relation Age of Onset    Ovarian Cancer Sister     Ovarian Cancer Maternal  Grandmother     Bone Cancer Paternal Grandmother     Breast Cancer Maternal Aunt     Heart Attack Maternal Uncle        ADVANCE DIRECTIVE: N, <no information>    Vitals:    11/11/23 0938   BP: 114/74   BP Site: Right Upper Arm   Patient Position: Sitting   BP Cuff Size: Medium Adult   Pulse: 68   SpO2: 99%   Weight: 60.3 kg (133 lb)   Height: 1.6 m (5' 3)     Estimated body mass index is 23.56 kg/m as calculated from the following:    Height as of this encounter: 1.6 m (5' 3).    Weight as of this encounter: 60.3 kg (133 lb).       Objective   Physical Exam         No data to display                No results found for: CHOL, CHOLFAST, TRIG, TRIGLYCFAST, HDL, GLUF, GLUCOSE, LABA1C    The ASCVD Risk score (Arnett DK, et al., 2019) failed to calculate for the following reasons:    Cannot find a previous HDL lab    Cannot find a previous total cholesterol lab      There is no immunization history on file for this patient.    Health Maintenance   Topic Date Due    HIV screen  Never done    Hepatitis C screen  Never done    Hepatitis B vaccine (1 of 3 - 19+ 3-dose series) Never done    Lipids  Never done    Breast cancer screen  03/08/2020    Flu vaccine (1) 10/10/2023    COVID-19 Vaccine (1 - 2024-25 season) Never done    Depression Screen  11/10/2024    Colorectal Cancer Screen  04/12/2027    DTaP/Tdap/Td vaccine (2 - Td or Tdap) 08/21/2032  HPV vaccine (No Doses Required) Completed    Hepatitis A vaccine  Aged Out    Hib vaccine  Aged Out    Polio vaccine  Aged Out    Meningococcal (ACWY) vaccine  Aged Out    Meningococcal B vaccine  Aged Out    Pneumococcal 0-49 years Vaccine  Aged Out           Assessment & Plan  Encounter for annual physical exam     General wellness exam. Reviewed chart for past hx and updated today. Counseled on age appropriate health guidance and discussed screening recommendations. Vaccinations reviewed and discussed. All questions answered    Orders:    Urinalysis with  Reflex to Culture; Future    CBC with Auto Differential; Future    Lipid Panel; Future    Comprehensive Metabolic Panel; Future    Multiple food allergies       Orders:    [facility] - Skupin, Melissa, MD, Allergy & Immunology, [facility]    Elevated TSH       Orders:    TSH reflex to FT4,FT3 ([place] Only); Future    Iron deficiency anemia, unspecified iron deficiency anemia type       Orders:    Ferritin; Future    Iron and TIBC; Future    Hx of migraine headaches            BRCA2 genetic carrier              Return in about 1 year (around 11/10/2024), or if symptoms worsen or fail to improve, for Annual Physical.           --Leotis Birk, MD

## 2023-11-11 NOTE — Assessment & Plan Note (Signed)
{  A/P Summary:[phone]}

## 2023-11-11 NOTE — Assessment & Plan Note (Addendum)
{  A/P Summary:[phone]}    Orders:    AFL - Skupin, Melissa, MD, Allergy & Immunology, [facility]

## 2023-11-12 ENCOUNTER — Encounter

## 2023-11-12 LAB — CBC WITH AUTO DIFFERENTIAL
Basophils %: 0.6 %
Basophils Absolute: 0.1 K/uL (ref 0.0–0.2)
Eosinophils %: 5.6 %
Eosinophils Absolute: 0.5 K/uL (ref 0.0–0.6)
Hematocrit: 36.2 % (ref 36.0–48.0)
Hemoglobin: 12.5 g/dL (ref 12.0–16.0)
Lymphocytes %: 28.1 %
Lymphocytes Absolute: 2.4 K/uL (ref 1.0–5.1)
MCH: 30.7 pg (ref 26.0–34.0)
MCHC: 34.5 g/dL (ref 31.0–36.0)
MCV: 88.8 fL (ref 80.0–100.0)
MPV: 7.8 fL (ref 5.0–10.5)
Monocytes %: 7.3 %
Monocytes Absolute: 0.6 K/uL (ref 0.0–1.3)
Neutrophils %: 58.4 %
Neutrophils Absolute: 5 K/uL (ref 1.7–7.7)
Platelets: 345 K/uL (ref 135–450)
RBC: 4.08 M/uL (ref 4.00–5.20)
RDW: 12.8 % (ref 12.4–15.4)
WBC: 8.6 K/uL (ref 4.0–11.0)

## 2023-11-12 LAB — URINALYSIS WITH REFLEX TO CULTURE
Bilirubin, Urine: NEGATIVE
Blood, Urine: NEGATIVE
Glucose, Ur: NEGATIVE mg/dL
Ketones, Urine: NEGATIVE mg/dL
Leukocyte Esterase, Urine: NEGATIVE
Nitrite, Urine: NEGATIVE
Protein, UA: NEGATIVE mg/dL
Specific Gravity, UA: 1.013 (ref 1.005–1.030)
Urobilinogen, Urine: 0.2 U/dL (ref <2.0)
pH, Urine: 7.5 (ref 5.0–8.0)

## 2023-11-12 LAB — LIPID PANEL
Cholesterol, Total: 209 mg/dL — ABNORMAL HIGH (ref 0–199)
HDL: 64 mg/dL — ABNORMAL HIGH (ref 40–60)
LDL Cholesterol: 125 mg/dL — ABNORMAL HIGH (ref <100)
Triglycerides: 99 mg/dL (ref 0–150)
VLDL Cholesterol Calculated: 20 mg/dL

## 2023-11-12 LAB — COMPREHENSIVE METABOLIC PANEL
ALT: 18 U/L (ref 10–40)
AST: 22 U/L (ref 15–37)
Albumin/Globulin Ratio: 1.8 (ref 1.1–2.2)
Albumin: 4.6 g/dL (ref 3.4–5.0)
Alkaline Phosphatase: 60 U/L (ref 40–129)
Anion Gap: 10 (ref 3–16)
BUN: 20 mg/dL (ref 7–20)
CO2: 26 mmol/L (ref 21–32)
Calcium: 9.4 mg/dL (ref 8.3–10.6)
Chloride: 101 mmol/L (ref 99–110)
Creatinine: 0.7 mg/dL (ref 0.6–1.1)
Est, Glom Filt Rate: >90 (ref >60)
Glucose: 86 mg/dL (ref 70–99)
Potassium: 4.2 mmol/L (ref 3.5–5.1)
Sodium: 137 mmol/L (ref 136–145)
Total Bilirubin: 0.4 mg/dL (ref 0.0–1.0)
Total Protein: 7.1 g/dL (ref 6.4–8.2)

## 2023-11-12 LAB — IRON AND TIBC
Iron % Saturation: 28 % (ref 15–50)
Iron: 82 ug/dL (ref 37–145)
TIBC: 294 ug/dL (ref 260–445)

## 2023-11-12 LAB — TSH REFLEX TO FT4, T3: TSH: 6.2 u[IU]/mL — ABNORMAL HIGH (ref 0.27–4.20)

## 2023-11-12 LAB — T4, FREE: T4 Free: 1.1 ng/dL (ref 0.9–1.8)

## 2023-11-12 LAB — FERRITIN: Ferritin: 64.2 ng/mL (ref 15.0–150.0)

## 2023-11-14 ENCOUNTER — Encounter

## 2023-12-01 LAB — ALLERGEN INTERPRETATION

## 2023-12-01 LAB — MISC ARUP 1

## 2023-12-03 LAB — ALLERGEN SOYBEAN IGE: Soybean IgE: <0.1 kU/L (ref 0.00–0.34)

## 2023-12-03 LAB — ALLERGEN CASHEW NUT IGE: Cashew IgE: <0.1 kU/L (ref 0.00–0.34)

## 2023-12-03 LAB — ALLERGEN PECAN NUT IGE: Allergen Pecan Nut IGE: <0.1 kU/L (ref 0.00–0.34)

## 2023-12-03 LAB — ALLERGEN BRAZIL NUT IGE: Brazil nut IgE class: <0.1 kU/L (ref 0.00–0.34)

## 2023-12-03 LAB — ALLERGEN ALMOND IGE: Allergen Almond IGE: 0.76 kU/L — ABNORMAL HIGH (ref 0.00–0.34)

## 2023-12-03 LAB — ALLERGEN HAZELNUT/FILBERT IGE: ALLERGEN HAZELNUT: 5.66 kU/L — ABNORMAL HIGH (ref 0.00–0.34)

## 2023-12-03 LAB — ALLERGEN WALNUT/BLACK WALNUT IGE: Walnut IgE: <0.1 kU/L (ref 0.00–0.34)

## 2023-12-25 NOTE — HIE Documentation (Unsigned)
 "    Transcription Designer, Multimedia Text  [facility]  History and Physical  Patient: Linda King MRN: [id number]  Date of Birth: [birth year]  Age: 48 year old  Sex: female  Unit: [facility] Room/Bed: ENEND/PB Location: [facility]  Procedure: Procedure(s):  MAC ESOPHAGOGASTRODUODENOSCOPY POSSIBLE BIOPSY AND/OR POLYPECTOMY AND/OR  DILATATION  Indication:  1. Dyspepsia  2. FH: stomach cancer  3. BRCA2 positive  Referring  Physician: Emerson Leach, MD  Nurses past medical history notes reviewed and agreed.  Medications reviewed.  Allergies: Hyoscyamine sulfate  Allergies noted: Yes  Past Medical History:  Past Medical History:  Diagnosis Date   BRCA2 positive   Esophageal reflux   Iron deficiency anemia   Migraine  Past Surgical History:  Past Surgical History:  Procedure Laterality Date   BILATERAL SALPINGOOPHORECTOMY   COLONOSCOPY  2019   COLONOSCOPY N/A 04/26/2022   Procedure: MAC COLONOSCOPY POSSIBLE BIOPSY AND/OR POLYPECTOMY;  Surgeon:   Laurita Kayla Boer, MD;  Location: [facility];  Service: Gastroenterology;  Laterality: N/A;  MAC COLONOSCOPY   HYSTERECTOMY   HYSTERECTOMY LAPAROSCOPIC TOTAL   OOPHORECTOMY  Social History:  Social History  Socioeconomic History   Marital status: Married    Spouse name: Not on file   Number of children: Not on file   Years of education: Not on file   Highest education level: Not on file  Occupational History   Not on file  Tobacco Use   Smoking status: Never   Smokeless tobacco: Never  Vaping Use   Vaping status: Never Used  Substance and Sexual Activity   Alcohol use: Not Currently    Alcohol/week: 1.0 standard drink of alcohol    Types: 1 Cans of beer per week    Comment: TWICE MONTHLY   Drug use: Not Currently   Sexual activity: Yes    Partners: Male    Birth control/protection: Surgical  Other Topics Concern   Appropriate use of safety belts in car? Yes   Do you perform a monthly self breast exam? Yes   Do  you take any type medication for Osteoporosis? No   Do you exercise? Yes    Comment: STRENGTHEN, 5X WEEKLY, 1.5 HOURS   Have you been given advanced directives? No  Social History Narrative   Not on file  Social Drivers of Health  Financial Resource Strain: Not on file  Food Insecurity: Not At Risk (11/14/2023)   Food Insecurities    Worried about running out of food: No    Food Bought: No  Transportation Needs: Not At Risk (11/14/2023)   Transportation    Worried about transportation: No  Physical Activity: Insufficiently Active (11/08/2023)   Received from [facility]   Exercise Vital Sign    Days of Exercise per Week: 3 days    Minutes of Exercise per Session: 30 min  Stress: Not on file  Social Connections: Not on file  Intimate Partner Violence: Not At Risk (11/14/2023)   Interpersonal Safety    Feel physically or emotionally unsafe where currently live: No    Harm by anyone: No    Emotionally Harmed: No  Housing Stability: Not At Risk (11/14/2023)   Housing/Utilities    Worried about losing home: No    Stayed outside house: No    Unable to get utilities: No  Family History:  Family History  Problem Relation Age of Onset   Colon cancer Mother  Lynch syndrome Mother   High BP Brother   Thyroid Disease Sister   Uterine cancer Maternal Grandmother   Breast cancer Maternal Aunt  Home Medications:  Prior to Admission medications  Medication Sig Start Date End Date Taking? Authorizing Provider  Coenzyme Q10 10 MG CAPS Take  by mouth.   Yes HISTORICAL MED  OMEGA-3 FATTY ACIDS PO Take 1,250 mg by mouth.   Yes HISTORICAL MED  Magnesium 400 MG CAPS Take  by mouth.   Yes HISTORICAL MED  Soft Lens Products (BIOTRUE) SOLN by Does Not Apply route.   Yes HISTORICAL   MED  estradiol 0.1 MG/24HR PTTW Apply 1 patch topically 2 (two) times a week.   10/21/21  Yes HISTORICAL MED  Multiple Vitamin (MULTIVITAMIN) TABS Take 1 tablet by mouth daily.   Yes  HISTORICAL MED  Vonoprazan Fumarate (VOQUEZNA) 20 MG TABS Take 20  mg by mouth daily.  Patient not taking: Reported on 11/14/2023 02/21/23   Feliciana Garvin PARAS., CNP  Ferrous Sulfate (IRON PO) Take  by mouth.  Patient not taking: Reported on 11/14/2023    HISTORICAL MED  Review of Systems:  Weight Loss: No  Dysphagia: No  Dyspepsia: No  Physical Exam:  Vital Signs: Ht 63 (160 cm)   Wt 133 lb (60.3 kg)   BMI 23.56 kg/m??  Vital signs reviewed:Yes  HEENT:Normal  Cardiac:Normal  Chest:Normal  Abdomen:Normal  Exts: Normal  Neuro:Normal  Labs:  [facility] on 11/28/2023  Component Date Value Ref Range Status   MISCELLANEOUS TEST 1 11/28/2023 SEE NOTE   Final   INTERP, IMMUNOCAP SCORE IGE 11/30/2023 See Note   Final  [facility] on 11/28/2023  Component Date Value Ref Range Status   MISCELLANEOUS TEST 1 11/28/2023 SEE NOTE   Final   INTERP, IMMUNOCAP SCORE IGE 11/30/2023 See Note   Final  [facility] on 11/28/2023  Component Date Value Ref Range Status   MISCELLANEOUS TEST 1 11/28/2023 SEE NOTE   Final   INTERP, IMMUNOCAP SCORE IGE 11/30/2023 See Note   Final  [facility] on 11/28/2023  Component Date Value Ref Range Status   MISCELLANEOUS TEST 1 11/28/2023 SEE NOTE   Final   INTERP, IMMUNOCAP SCORE IGE 11/30/2023 See Note   Final   Allergen, Food, Almond IgE 11/28/2023 0.76 (H)  0.00 - 0.34 kU/L Final   Allergen, Food, Brazil Nut IgE 11/28/2023 <0.10  0.00 - 0.34 kU/L Final   Allergen, Food, Walnut/Black Walnu* 11/28/2023 <0.10  0.00 - 0.34 kU/L Final   Allergen, Food, Cashew IgE 11/28/2023 <0.10  0.00 - 0.34 kU/L Final   Allergen, Food, Hazelnut (Filbert)* 11/28/2023 5.66 (H)  0.00 - 0.34 kU/L   Final   Allergen, Food, Pecan IgE 11/28/2023 <0.10  0.00 - 0.34 kU/L Final   Allergen, Food, Soybean IgE 11/28/2023 <0.10  0.00 - 0.34 kU/L Final  [facility] on 11/12/2023  Component Date Value Ref Range Status   URINE COLOR 11/12/2023 Yellow  Straw/Yellow Final   URINE CLARITY 11/12/2023 Clear  Clear Final   URINE GLUCOSE 11/12/2023  Negative  Negative mg/dL Final   URINE BILIRUBIN 11/12/2023 Negative  Negative Final   URINE KETONES 11/12/2023 Negative  Negative mg/dL Final   URINE SPECIFIC GRAVITY 11/12/2023 1.013  1.005 - 1.030 Final   URINE BLOOD 11/12/2023 Negative  Negative Final   URINE PH 11/12/2023 7.5  5.0 - 8.0 Final   PROTEIN, URINE 11/12/2023 Negative  Negative mg/dL Final   URINE UROBILINOGEN 11/12/2023 0.2  <2.0  E.U./dL Final   URINE NITRITES 11/12/2023 Negative  Negative Final   LEUKOCYTE ESTERASE 11/12/2023 Negative  Negative Final   MICROSCOPIC EXAMINATION 11/12/2023 Not Indicated   Final   URINE TYPE 11/12/2023 [place]   Final   REFLEX CULTURE 11/12/2023 Not Indicated   Final   WBC 11/12/2023 8.6  4.0 - 11.0 K/uL Final   RBC 11/12/2023 4.08  4.00 - 5.20 M/uL Final   HEMOGLOBIN 11/12/2023 12.5  12.0 - 16.0 g/dL Final   HEMATOCRIT 90/96/7974 36.2  36.0 - 48.0 % Final   MCV 11/12/2023 88.8  80.0 - 100.0 fL Final   MCH 11/12/2023 30.7  26.0 - 34.0 pg Final   MCHC 11/12/2023 34.5  31.0 - 36.0 g/dL Final   RDW 90/96/7974 12.8  12.4 - 15.4 % Final   PLATELET 11/12/2023 345  135 - 450 K/uL Final   MPV 11/12/2023 7.8  5.0 - 10.5 fL Final   NEUTROPHILS 11/12/2023 58.4  % Final   LYMPHOCYTES 11/12/2023 28.1  % Final   MONOCYTES 11/12/2023 7.3  % Final   EOSINOPHIL 11/12/2023 5.6  % Final   BASOPHILS 11/12/2023 0.6  % Final   ABS NEUTROPHIL 11/12/2023 5.0  1.7 - 7.7 K/uL Final   ABS LYMPHS 11/12/2023 2.4  1.0 - 5.1 K/uL Final   ABS MONOS 11/12/2023 0.6  0.0 - 1.3 K/uL Final   ABS EOS 11/12/2023 0.5  0.0 - 0.6 K/uL Final   ABS BASOS 11/12/2023 0.1  0.0 - 0.2 K/uL Final   IRON 11/12/2023 82  37 - 145 ug/dL Final   IRON BINDING CAPACITY 11/12/2023 294  260 - 445 ug/dL Final   % SATURATION 90/96/7974 28  15 - 50 % Final   FERRITIN 11/12/2023 64.2  15.0 - 150.0 ng/mL Final   TSH W/REFLEX 11/12/2023 6.20 (H)  0.27 - 4.20 uIU/mL Final   SODIUM 11/12/2023 137  136 - 145 mmol/L Final   POTASSIUM 11/12/2023 4.2  3.5 - 5.1 mmol/L Final   CHLORIDE  11/12/2023 101  99 - 110 mmol/L Final   CO2 11/12/2023 26  21 - 32 mmol/L Final   ANION GAP 11/12/2023 10  3 - 16 Final   GLUCOSE 11/12/2023 86  70 - 99 mg/dL Final   BLD UREA NITROGEN 11/12/2023 20  7 - 20 mg/dL Final   CREATININE 90/96/7974 0.7  0.6 - 1.1 mg/dL Final   GFR CALC 90/96/7974 >90  >60 Final   CALCIUM 11/12/2023 9.4  8.3 - 10.6 mg/dL Final   PROTEIN, TOTAL 11/12/2023 7.1  6.4 - 8.2 g/dL Final   ALBUMIN 90/96/7974 4.6  3.4 - 5.0 g/dL Final   A/G RATIO 90/96/7974 1.8  1.1 - 2.2 Final   BILIRUBIN, TOTAL 11/12/2023 0.4  0.0 - 1.0 mg/dL Final   ALK PHOSPHATASE 11/12/2023 60  40 - 129 U/L Final   ALT 11/12/2023 18  10 - 40 U/L Final   AST 11/12/2023 22  15 - 37 U/L Final   FREE T4 (THYROXINE) 11/12/2023 1.1  0.9 - 1.8 ng/dL Final  Imaging:  No orders to display  ASA:See Anesthesia Record  Mallampati Score: See Anesthesia Record  Sedation planned:MAC  Patient in acceptable condition for procedure:Yes  9:23 AM 12/25/2023  Howard Hao Zhang, MD  "

## 2023-12-25 NOTE — HIE Documentation (Unsigned)
 "    Transcription Authentication Interface Message Text  Procedures  EGD Procedure Note  Patient: Linda King MRN: [id number]  Date of Birth: [birth year]  Age: 48 year old  Sex: female  Unit: [facility] Room/Bed: ENEND/PB Location: [facility]  Admitting Physician: LAURITA BARRIO HAO  Primary Care Physician: Emerson Leach, MD  INDICATIONS:  1. Dyspepsia  2. FH: stomach cancer  3. BRCA2 positive  DATE OF OPERATION: 12/25/2023  OPERATIVE SURGEON: Barrio Scot Laurita, MD  PREOPERATIVE DIAGNOSIS: Dyspepsia [R10.13], Family history of stomach cancer  [Z80.0], BRCA2 positive [Z15.01, Z15.09]  POSTOPERATIVE DIAGNOSIS: Dyspepsia [R10.13], Family history of stomach cancer  [Z80.0], BRCA2 positive [Z15.01, Z15.09]  PROCEDURES PERFORMED:  EGD with biopsies  Pre-Procedure Exam:  The patient is competent.  The risks and benefits of the procedure and the  sedation options and risks were discussed with the patient.  All questions   were  answered, and informed consent was obtained.  Patient identification and  proposed procedure were verified by the physician, the nurse and the   anesthetist  in the pre-procedure area and in the procedure room.  Mental Status Examination: normal.  Airway Examination: normal oropharyngeal airway and neck mobility.  Mallampati Score: See Anesthesia Record  Respiratory Examination: clear to auscultation.  CV Examination: normal.  Abdominal exam:  positive bowel sound, soft, non-tender and non-distended.  Prophylactic Antibiotics: The patient does not require prophylactic   antibiotics.  Prior Antiplatelet/Anticoagulants: The patient has taken no anticoagulant or  antiplatelet agents.  ASA Grade Assessment: See Anesthesia Record  Procedure in Detail:  After reviewing the risks and benefits, the patient was deemed in satisfactory  condition to undergo the procedure.  The anesthesia plan was to use Deep  Sedation with Monitored Anesthesia Care (MAC).  Immediately  prior to  administration of medications, the patient was re-assessed for adequacy to  receive sedatives.  The heart rate, respiratory rate, oxygen saturations,   blood  pressure, adequacy of pulmonary ventilation, and response to care were   monitored  throughout the procedure.  The physical status of the patient was re-assessed  after the procedure.  After I obtained informed consent, the scope was passed under direct vision.  The Endoscope was introduced through the mouth, and advanced to the third part  of duodenum.  A careful inspection was made as the gastroscope was withdrawn,  including a retroflexed view of the proximal stomach; findings and   interventions  are described below. Appropriate photo documentation was obtained.  The upper   GI  endoscopy was performed with ease.  The patient tolerated the procedure well.  Event Time In  Procedure Start(Endo/Cath/IR/EP) 1037  Procedure Finish (Endo/Cath/IR/EP) 1042  Medications:   Monitored Anesthesia Care (MAC) per anesthesia note  Findings:  - The examined esophagus was normal.  - The examined stomach was normal with no gross inflammation, ulcer or mass  lesion. Biopsies were taken with a cold forceps to r/o H. Pylori.  - The examined duodenum was normal.  Estimated Blood Loss: Minimal to none  Complications:  No immediate complications.  Recommendations:  - Patient has a contact number available for emergencies.  The sign and   symptoms  of potential delayed complications were discussed with the patient.  Return to  normal activities tomorrow.  Written discharge instructions were provided to   the  patient.  - Await pathology results.  - Continue present medications.  - Resume previous diet.  - Return to referring  physician as previously scheduled.  The findings and recommendations were discussed with patient and patient's  family.  Attending Attestation: I personally performed the entire procedure.  Signed By: Howard Hao Zhang, MD  "

## 2024-01-06 NOTE — Progress Notes (Signed)
 "Formatting of this note is different from the original.  Neurosurgeon and Reconstructive Surgery    CONSULT/H&P NOTE         CC: patient is seen at the request of Dr  Fredric  for breast reconstruction     HISTORY OF PRESENT ILLNESS: Linda King is a 48 year old female who presents with genetic predisposition for breast cancer .  She is here to discuss options for reconstruction.  She is leaning towards bilateral prophylactic mastectomy.     Hypercoagulable Events/Miscarriage: none  Smoking: none  Radiation: no prior history  Chemotherapy: none   Hormone therapy: none  Genetic testing: BRCA2  Family history: + family history of breast or ovarian cancer  Previous abdominal surgery/back surgery: none  Liposuction: none     REVIEW OF SYSTEMS:      Gen: no fevers or chills, no significant weight loss or gain,  normal exercise tolerance  Skin:  no rashes or lesions, no pigmentation changes  HEENT:  no significant changes in hearing or vision, no ear pain, no sinus problems or sore throat, no oral lesions  Respiratory:  no cough or dyspnea  Cardiac:  no chest pain, no palpitations, no syncope, no pnd, no orthopnea  GI:  no heartburn, no abdominal pain, abdominal hernia, no change in bowel habits, no hematochezia, no nausea or vomiting  Genitourinary: no difficulty with urination, no polyuria or hematuria  Musculoskeletal:  no significant joint aches or myalgias, no limitation of movement  Neurologic:  no headache or paresthesias, no dizziness or lightheadedness,   Endocrine:  no heat or cold intolerance, polydipsia or polyuria  Psych: no depression or anxiety  Hematologic: no easy bruising or bleeding, no history of clots, no history of anemia    PAST MEDICAL HISTORY:  Past Medical History:   Diagnosis Date    BRCA2 positive     Esophageal reflux     Iron deficiency anemia     Migraine        PAST SURGICAL HISTORY:  Past Surgical History:   Procedure Laterality Date    BILATERAL SALPINGOOPHORECTOMY       COLONOSCOPY  2019    COLONOSCOPY N/A 04/26/2022    Procedure: MAC COLONOSCOPY POSSIBLE BIOPSY AND/OR POLYPECTOMY;  Surgeon: Laurita Kayla Boer, MD;  Location: [facility];  Service: Gastroenterology;  Laterality: N/A;  MAC COLONOSCOPY     EGD N/A 12/25/2023    Procedure: MAC ESOPHAGOGASTRODUODENOSCOPY POSSIBLE BIOPSY AND/OR POLYPECTOMY AND/OR DILATATION;  Surgeon: Laurita Kayla Boer, MD;  Location: [facility];  Service: Gastroenterology;  Laterality: N/A;  egd w bx    HYSTERECTOMY      HYSTERECTOMY LAPAROSCOPIC TOTAL      OOPHORECTOMY         PERTINENT FAMILY MEDICAL HISTORY:  Family History   Problem Relation Age of Onset    Colon cancer Mother     Lynch syndrome Mother     High BP Brother     Thyroid Disease Sister     Uterine cancer Maternal Grandmother     Breast cancer Maternal Aunt        SOCIAL HISTORY:   Social History     Socioeconomic History    Marital status: Married     Spouse name: Not on file    Number of children: Not on file    Years of education: Not on file    Highest education level: Not on file   Occupational History    Not on  file   Tobacco Use    Smoking status: Never    Smokeless tobacco: Never   Vaping Use    Vaping status: Never Used   Substance and Sexual Activity    Alcohol use: Not Currently     Alcohol/week: 1.0 standard drink of alcohol     Types: 1 Cans of beer per week     Comment: TWICE MONTHLY    Drug use: Not Currently    Sexual activity: Yes     Partners: Male     Birth control/protection: Surgical   Other Topics Concern    Appropriate use of safety belts in car? Yes    Do you perform a monthly self breast exam? Yes    Do you take any type medication for Osteoporosis? No    Do you exercise? Yes     Comment: STRENGTHEN, 5X WEEKLY, 1.5 HOURS    Have you been given advanced directives? No   Social History Narrative    Not on file     Social Drivers of Health     Financial Resource Strain: Not on file   Food Insecurity: Not At Risk (11/14/2023)    Food Insecurities     Worried  about running out of food: No     Food Bought: No   Transportation Needs: Not At Risk (11/14/2023)    Transportation     Worried about transportation: No   Physical Activity: Insufficiently Active (11/08/2023)    Received from [facility]    Exercise Vital Sign     Days of Exercise per Week: 3 days     Minutes of Exercise per Session: 30 min   Stress: Not on file   Social Connections: Not on file   Intimate Partner Violence: Not At Risk (11/14/2023)    Interpersonal Safety     Feel physically or emotionally unsafe where currently live: No     Harm by anyone: No     Emotionally Harmed: No   Housing Stability: Not At Risk (11/14/2023)    Housing/Utilities     Worried about losing home: No     Stayed outside house: No     Unable to get utilities: No       DRUG/FOOD ALLERGIES:  Hyoscyamine sulfate    MEDICATIONS:    Current Outpatient Medications:     Coenzyme Q10 10 MG CAPS, Take  by mouth., Disp: , Rfl:     OMEGA-3 FATTY ACIDS PO, Take 1,250 mg by mouth., Disp: , Rfl:     Magnesium 400 MG CAPS, Take  by mouth., Disp: , Rfl:     Vonoprazan Fumarate (VOQUEZNA) 20 MG TABS, Take 20 mg by mouth daily. (Patient not taking: Reported on 11/14/2023), Disp: 90 tablet, Rfl: 1    Ferrous Sulfate (IRON PO), Take  by mouth. (Patient not taking: Reported on 11/14/2023), Disp: , Rfl:     Soft Lens Products (BIOTRUE) SOLN, by Does Not Apply route., Disp: , Rfl:     estradiol 0.1 MG/24HR PTTW, Apply 1 patch topically 2 (two) times a week., Disp: , Rfl:     Multiple Vitamin (MULTIVITAMIN) TABS, Take 1 tablet by mouth daily., Disp: , Rfl:         PHYSICAL EXAM:  There were no vitals filed for this visit.    Constitutional: She is oriented to person, place, and time. No distress.   HENT:   Head: Normocephalic and atraumatic.   Eyes: Conjunctivae and EOM are normal.  No scleral icterus.   Neck: Normal range of motion. No JVD present.   Cardiovascular: Normal rate and regular rhythm.    Pulmonary/Chest: Effort normal and breath  sounds normal.     Bilateral breasts, with minimal asymmetry.  Left breast equal to the right breast in size.    There are no palpable mass in the breasts, axilla, clavicle.  No nipple discharge.      Left breast:  Grade 1 ptosis  NAC position is midline the breast meridian  Nipple to sternal notch distance: 21.5cm  Nipple to IMF distance: 6.5cm  Base width: 12cm  Scars: none  Radiation changes: none     Right breast:  Grade 2 ptosis  NAC position is midline the breast meridian  Nipple to sternal notch distance: 22.5cm  Nipple to IMF distance: 6.5cm  Base width: 12cm  Scars: none  Radiation changes: none     Bilateral functioning LD and normal shoulder range of motion     Abdominal: Soft. She exhibits no distension. There is no tenderness.   Adequate donor site, no hernias    Musculoskeletal: Normal range of motion. She exhibits no edema.   Neurological: She is alert and oriented to person, place, and time.   Psychiatric: Mood and affect normal.       RADIOLOGY: none    ASSESSMENT/RECOMMENDATIONS:     Genetic predisposition for cancer of the breast     A detailed conversation was had regarding the patient?s options for breast reconstruction. I specifically discussed:   1.Breast reconstruction is an optional process. In addition, breast reconstruction can be performed in an immediate and delayed fashion. Even if a patient does not opt for reconstruction now, it can be performed at a later time. There is an alternative to breast reconstruction; that alternative is to wear an external prosthesis.  2. I discussed that the breast will be numb from the mastectomy and even though we reconstruct the breast, it will likely remain numb for the rest of her life.  3.Breast reconstruction regardless of the method she chooses, is a multi-step process that may involve multiple surgeries spaced several months apart. The entire process may take over 1 year. The patient can stop within this process and resume again anytime.   4.The  primary goal of breast reconstruction is to have the patient look normal in clothing. Without clothes, it would be readily apparent that the breast will look operated on, usually due to scars or other stigmata of the breast reconstruction process.   5.Asymmetries are often present during the breast reconstruction process. Several operations may be needed, including surgery to be non cancerous breast, to achieve satisfactory results.   6.No matter the reconstruction method, there are ways that the reconstruction can fail and a secondary reconstructive plan may need to be created.  Next, a general discussion regarding all available methods of breast reconstruction was discussed: expander/implant reconstruction, autologous tissue reconstruction, and a combination of autologous tissue plus implant. Not every option is a good option for every patient. It depends on multiple factors including prior surgeries, body habitus, and history or or need for breast radiation.  A general discussion was had about the adverse effects of radiation on implant based reconstruction, as well as radiation effects on autologous flaps.    For expander/implant reconstruction (single and multi stage approaches)  I reviewed the stepwise nature of expander-implant breast reconstruction.   I discussed that a select group of patients are candidates for direct to implant (single  stage) implant reconstruction and even is this method is undertaken in the ideal patient the need for revision surgery is relatively high.   The risks include but are not limited to: palpability and visibility of the implant, a life long risk of infection, life long risk of capsular contracture, implant rupture that will necessitate replacement at least every 10-15 years and ALCL. We did discuss the FDA recommendations regarding monitoring of gel implants.  For autologous breast reconstruction:  Autologous only reconstruction, including free abdominal tissue-based  reconstructions. Further delineation between a pedicled and free TRAM, muscle-sparing free TRAM, DIEP, and SIEA flap were discussed.   We discussed using the excess abdominal tissue typically removed in an abdominoplasty. This tissue is removed either preserving all of the abdominal wall muscle and fascia or taking a small piece of muscle and fascia. We discussed the differences between the free Tram flap, the SIEA flap, and the DIEP flap. We outlined how in selected patients we moved through this algorithm in an attempt to preserve as much muscle and fascia as possible. This tissue with an artery and vein is then completely removed from the abdomen and brought up to the mastectomy defect. Arterial and venous anastomoses are performed so as to reestablish the blood supply to the flap.   Secondary free flaps including gluteal and thigh-based flaps were briefly discussed.   The risks include but are not limited to: failure of the reconstruction, need for re-operation,fat necrosis, abdominal hernia, tightness, umbilical necrosis, pain, delayed healing at the mastectomy and abdominal donor site and need for dressing changes.   Combination procedures, particular the latissimus dorsi flap combined with either expanders or implants, includes the complications of both type of procedures.  For each of the reconstruction methods mentioned above, the risks, benefits, alternatives, scarring, and recovery time were discussed in great detail.  General surgical risks include but are not limited to: bleeding, infection, scars, blood or fluid collection, assymetries, failure, need for re-operation, nerve or muscle damage, mastectomy flap necrosis, wound healing complications and need for dressing changes were discussed . We also discussed the potential for vertical asymmetry particularly in unilateral reconstruction. We did also discuss the potential for a balancing procedure on the opposite breast in cases of unilateral  reconstruction.     Oncoplasty: we discussed re-arranging the surrounding breast tissues to recreated the breast shape in the setting of lumpectomy.  We also discussed [place] tissue rearrangement to recreate the breast shape in the setting of mastectomy.  The sizes of the reconstructed breast mound will be smaller than with implant or flap based reconstruction.  Additional risks include seroma, fat necrosis, delay wound healing.   Once all reconstruction options are presented, a focused discussion was had regarding the patient?s suitability for each of these procedures.   Plan:   She is here to explore options for reconstruction, although she has not decided when to proceed with surgery yet. But based on our discussions today, she would like to eventually proceed with the following:    Breast reconstruction with direct to implant, possible tissue expander placement and possible biologic mesh implant placement.  We also discussed doing a periareolar mastopexy on the right side first to try to match up the breasts as a possibility as well.     I spent a total of 60 minutes with the patient in which 75% was in counseling the patient and coordinating care. I believe the patient has a good concept of our goals and associated risks. All questions  were answered.  I will  Co-ordinate her care with Dr Tomi office.       Jeananne Miyamoto MD     [facility]    [place]  [address]  8th [address]  [place], [place]  [address]  Office [phone]    [place]  [address]  [address]  [place], [place]  [address]    [facility]  [address]  [place], [place] [address]  Office [phone]   Electronically signed by Miyamoto Jeananne, MD at 01/11/2024 11:52 PM EST  "

## 2024-01-20 ENCOUNTER — Ambulatory Visit
Admit: 2024-01-20 | Discharge: 2024-01-20 | Payer: PRIVATE HEALTH INSURANCE | Attending: Physician Assistant | Primary: Family Medicine

## 2024-01-20 VITALS — Ht 63.0 in | Wt 133.0 lb

## 2024-01-20 DIAGNOSIS — M546 Pain in thoracic spine: Principal | ICD-10-CM

## 2024-01-20 MED ORDER — MELOXICAM 15 MG PO TABS
15 | ORAL_TABLET | Freq: Every day | ORAL | 0 refills | Status: AC | PRN
Start: 2024-01-20 — End: ?

## 2024-01-20 NOTE — Progress Notes (Signed)
 "     New Patient: SPINE    01/20/2024     CHIEF COMPLAINT:    Chief Complaint   Patient presents with    New Patient     THORACIC        HISTORY OF PRESENT ILLNESS:              The patient is a 48 y.o. female self-referred for chronic, worsening thoracic back pain.  She reports a 6-year history of atraumatic aching midline thoracic back pain.  Her symptoms have been worsening over the last 1 year.  She also reports a history of chronic neck and trap discomfort with muscle tension.  Her thoracic back pain is most bothersome.  Her symptoms are increased with any prolonged activity and improved with resting and ice.  Conservative care includes 2 rounds of full PT with HEP, NSAIDs, muscle relaxers.  She denies any significant improvement at this time.  She denies any upper or lower extremity radiating pain.  She denies any radiating chest wall pain.  She denies any extremity numbness tingling or progressive weakness.  Denies any fine motor difficulty or gait instability.  Denies any recent injury or trauma.  Denies any progressive chest pain or dyspnea.    She had an evaluation through [facility], Dr. Ofilia    The pain assessment was noted & reviewed in the medical record today.     Current/Past Treatment:   Physical Therapy: Yes 2 full rounds with continued HEP  Chiropractic:     Injection:     Medications:            NSAIDS: Ibuprofen            Muscle relaxer: Yes            Steriods:              Neuropathic medications:              Opioids:            Other:   Surgery/Consult: no    Work Status/Functionality: Lawyer    Past Medical History: Medical history form was reviewed today & scanned into the media tab  No past medical history on file.   Past Surgical History:     Past Surgical History:   Procedure Laterality Date    HYSTERECTOMY, VAGINAL  12/18/2018     Current Medications:     Current Outpatient Medications:     estradiol (VIVELLE) 0.1 MG/24HR, APPLY 1 PATCH TO SKIN 2 TIMES WEEKLY.,  Disp: , Rfl:     Potassium 99 MG TABS, Take by mouth, Disp: , Rfl:     Magnesium 400 MG CAPS, Take by mouth, Disp: , Rfl:     Omega-3 Fatty Acids (OMEGA 3 PO), Take 1,250 mg by mouth, Disp: , Rfl:     Coenzyme Q10 (CO Q 10 PO), Take by mouth, Disp: , Rfl:     BIOTIN PO, Take 1,500 mg by mouth, Disp: , Rfl:     Ferrous Sulfate (IRON PO), Take 65 mg by mouth, Disp: , Rfl:     Zinc 30 MG TABS, Take by mouth, Disp: , Rfl:   Allergies:  Other  Social History:    reports that she has never smoked. She has never used smokeless tobacco. She reports current alcohol use of about 1.0 standard drink of alcohol per week. She reports that she does not use drugs.  Family History:   Family History  Problem Relation Age of Onset    Ovarian Cancer Sister     Ovarian Cancer Maternal Grandmother     Bone Cancer Paternal Grandmother     Breast Cancer Maternal Aunt     Heart Attack Maternal Uncle        REVIEW OF SYSTEMS: Full ROS reviewed & scanned into chart  CONSTITUTIONAL: Denies unexplained weight loss, fevers   SKIN: Denies active skin conditions   ENT: Denies dizziness, nosebleeds  RESPIRATORY: Denies difficulty breathing  CARDIOVASCULAR: Denies new chest pain   NEUROLOGICAL: Denies progressive weakness  PSYCHOLOGICAL: Denies anxiety, depression   HEMATOLOGIC: Denies blood disorders, cancer  ENDOCRINE: Denies excessive thirst, heat/cold  GI: Denies current nausea, vomiting, diarrhea   GU: Denies new bowel or bladder incontinence       PHYSICAL EXAM:    Vitals: Height 1.6 m (5' 3), weight 60.3 kg (133 lb).  Pain score 4/10    GENERAL EXAM:  General Apparence: Patient is adequately groomed with no evidence of malnutrition.  Orientation: The patient is oriented to time, place and person.   Mood & Affect:The patient's mood and affect are appropriate  Lymphatic: The lymphatic examination bilaterally reveals all areas to be without enlargement or induration  Sensation: Sensation is intact without deficit  Coordination/Balance: Good  coordination     CERVICAL EXAMINATION:  Inspection: Local inspection shows no step-off or bruising.  Cervical alignment is normal.     Palpation: Positive tenderness to palpation upper midline thoracic spine, bilateral trap tightness  Range of Motion: Intact flexion mild loss extension intact lateral rotation  Strength: 5/5 bilateral upper extremities   Special Tests:      Spurling's, L'Hermitte's & Hoffman's negative bilaterally.      Skin:There are no rashes, ulcerations or lesions in right & left upper extremities.  Reflexes: Bilaterally triceps, biceps and brachioradialis are 1-2+.        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  She has elected to pursue treatment course including  2) Thoracic MRI WO  3) Mobic  15mg  #30 i po qd PRN; informed to d/c other NSAIDs during. Take with food.  Side effect/risk discussed.  GI precaution.  4) Discussed concerning s/sx  5) Cont HEP  6) Will obtain Dr. Ofilia notes  7) F/u to review T MRI           Denym Rahimi Earnie Margo, PA-C, MPAS  Board Certified by the [facility]      "

## 2024-01-22 NOTE — Telephone Encounter (Signed)
"  Left a voicemail for Linda King regarding MRI Thoracic Spine approval and authorization being valid until 01/19/2025.  Patient was instructed that their MRI needs to be scheduled at [facility]. The patient was instructed to contact the facility to schedule  at [phone].    A follow up appointment has been scheduled for 02/11/2024 at the [facility] office.     The patient was provided contact information should the need arise to reschedule any of the appointments.     "

## 2024-01-27 ENCOUNTER — Inpatient Hospital Stay: Admit: 2024-01-27 | Payer: PRIVATE HEALTH INSURANCE | Primary: Family Medicine

## 2024-01-27 DIAGNOSIS — G8929 Other chronic pain: Principal | ICD-10-CM

## 2024-01-27 DIAGNOSIS — M546 Pain in thoracic spine: Secondary | ICD-10-CM

## 2024-02-10 ENCOUNTER — Encounter: Payer: PRIVATE HEALTH INSURANCE | Attending: "Endocrinology | Primary: Family Medicine

## 2024-02-10 NOTE — Progress Notes (Unsigned)
 "Patient ID: Linda King is a 48 y.o. female      Chief Complaint:     HPI:   Linda King is here for initial evaluation of     TSH 6.2, Free T4 1.1 - Sep 2025     Takes Levothyroxine mcg daily in morning empty stomach with water and waits for minutes.     Energy levels   Hair or skin changes   Weight   Heat cold intolerance   Depression or anxiety   Sweating, tremors, palpitations, sleep issues   Neck pain  Menstrual cycles are regular    Family history of thyroid disorder.    Neck radiation  Recent iodine loading in form of contrast material for diagnostic studies/cardiac cath  Multiple supplements including biotin 1,500 mg daily     Food supplements, herbs or medications including Biotin  Recent URTI      The following portions of the patient's history were reviewed and updated as appropriate:     Family History   Problem Relation Age of Onset    Ovarian Cancer Sister     Ovarian Cancer Maternal Grandmother     Bone Cancer Paternal Grandmother     Breast Cancer Maternal Aunt     Heart Attack Maternal Uncle           Social History     Socioeconomic History    Marital status: Married     Spouse name: Not on file    Number of children: Not on file    Years of education: Not on file    Highest education level: Not on file   Occupational History    Not on file   Tobacco Use    Smoking status: Never    Smokeless tobacco: Never   Vaping Use    Vaping status: Never Used   Substance and Sexual Activity    Alcohol use: Yes     Alcohol/week: 1.0 standard drink of alcohol     Types: 1 Glasses of wine per week     Comment: twice a month    Drug use: Never    Sexual activity: Not on file   Other Topics Concern    Not on file   Social History Narrative    Not on file     Social Drivers of Health     Financial Resource Strain: Not on file   Food Insecurity: Not At Risk (11/14/2023)    Received from [facility] and [employer] [facility]     Within the past 12 months, did you worry that your food  would run out before you got money to buy more?: No     Within the past 12 months, did the food you bought just not last and you didn't have money to get more?: No   Transportation Needs: Not At Risk (11/14/2023)    Received from [facility] and [employer]     Within the past 12 months, has a lack of transportation kept you from medical appointments or from doing things needed for daily living?: No   Physical Activity: Insufficiently Active (11/08/2023)    Exercise Vital Sign     Days of Exercise per Week: 3 days     Minutes of Exercise per Session: 30 min   Stress: Not on file   Social Connections: Not on file   Intimate Partner Violence: Not At Risk (11/14/2023)    Received from [facility]  and [facility]     Do you feel physically or emotionally unsafe where you currently live?: No     Within the past 12 months, have you been hit, slapped, kicked or otherwise physically hurt by anyone? : No     Within the past 12 months, have you been humiliated or emotionally abused by anyone? : No   Housing Stability: Not At Risk (11/14/2023)    Received from [facility] and [employer]    Housing/Utilities     Are you worried about losing your housing? : No     Within the past 12 months, have you ever stayed: outside, in a car, in a tent, in an overnight shelter, or temporarily in someone else's home (i.e. couch-surfing)?: No     Within the past 12 months, have you been unable to get utilities (heat, electricity) when it was really needed?: No        No past medical history on file.     Past Surgical History:   Procedure Laterality Date    HYSTERECTOMY, VAGINAL  12/18/2018          Allergies   Allergen Reactions    Other Hives     Quinoa           Current Outpatient Medications:     meloxicam  (MOBIC ) 15 MG tablet, Take 1 tablet by mouth daily as needed for Pain, Disp: 30 tablet, Rfl: 0    estradiol (VIVELLE) 0.1 MG/24HR, APPLY 1 PATCH TO SKIN 2 TIMES  WEEKLY., Disp: , Rfl:     Potassium 99 MG TABS, Take by mouth, Disp: , Rfl:     Magnesium 400 MG CAPS, Take by mouth, Disp: , Rfl:     Omega-3 Fatty Acids (OMEGA 3 PO), Take 1,250 mg by mouth, Disp: , Rfl:     Coenzyme Q10 (CO Q 10 PO), Take by mouth, Disp: , Rfl:     BIOTIN PO, Take 1,500 mg by mouth, Disp: , Rfl:     Ferrous Sulfate (IRON PO), Take 65 mg by mouth, Disp: , Rfl:     Zinc 30 MG TABS, Take by mouth, Disp: , Rfl:      Review of Systems:  Constitutional: Negative for fever, chills.   HENT: Negative for congestion, ear pain, rhinorrhea,  sore throat and trouble swallowing.   Eyes: Negative for photophobia, redness, itching.   Respiratory: Negative for cough, shortness of breath and sputum.   Cardiovascular: Negative for chest pain and leg swelling.   Gastrointestinal: Negative for nausea, vomiting, abdominal pain, diarrhea, constipation.   Endocrine: Negative for polydipsia, polyphagia and polyuria.   Genitourinary: Negative for dysuria, urgency, frequency, hematuria and flank pain.   Musculoskeletal: Negative for myalgias, back pain, arthralgias.   Skin/Nail: Negative for rash, itching. Normal nails.   Neurological: Negative for seizures, light-headedness, numbness and headaches.   Hematological/ Lymph nodes: Negative for adenopathy. Does not bruise/bleed easily.   Psychiatric/Behavioral: Negative for suicidal ideas and decreased concentration.      Physical Exam:  There were no vitals taken for this visit.   Constitutional: Patient is oriented to person, place, and time. Patient appears well-developed and well-nourished.   HENT:              Head: Normocephalic and atraumatic.               Eyes: Conjunctivae and EOM are normal. Pupils are equal, round, and reactive to light.  Neck: Normal range of motion.   Cardiovascular: Normal rate, regular rhythm and normal heart sounds.    Pulmonary/Chest: Effort normal and breath sounds normal.   Abdominal: Soft. Bowel sounds are normal.   Musculoskeletal:  Normal range of motion.   Neurological: Patient is alert and oriented to person, place, and time. Patient has normal reflexes.   Skin: Skin is warm and dry.   Psychiatric: Patient has a normal mood and affect. Patient behavior is normal.     Lab Review:    Orders Only on 11/30/2023   Component Date Value Ref Range Status    ALLERGEN SEE NOTE 11/30/2023 See Note   Final   Orders Only on 11/30/2023   Component Date Value Ref Range Status    ALLERGEN SEE NOTE 11/30/2023 See Note   Final   Orders Only on 11/30/2023   Component Date Value Ref Range Status    ALLERGEN SEE NOTE 11/30/2023 See Note   Final   Orders Only on 11/30/2023   Component Date Value Ref Range Status    ALLERGEN SEE NOTE 11/30/2023 See Note   Final   Orders Only on 11/28/2023   Component Date Value Ref Range Status    Soybean IgE 11/28/2023 <0.10  0.00 - 0.34 kU/L Final   Orders Only on 11/28/2023   Component Date Value Ref Range Status    Allergen Pecan Nut IGE 11/28/2023 <0.10  0.00 - 0.34 kU/L Final   Orders Only on 11/28/2023   Component Date Value Ref Range Status    ALLERGEN HAZELNUT 11/28/2023 5.66 (H)  0.00 - 0.34 kU/L Final   Orders Only on 11/28/2023   Component Date Value Ref Range Status    Cashew IgE 11/28/2023 <0.10  0.00 - 0.34 kU/L Final   Orders Only on 11/28/2023   Component Date Value Ref Range Status    Walnut IgE 11/28/2023 <0.10  0.00 - 0.34 kU/L Final   Orders Only on 11/28/2023   Component Date Value Ref Range Status    Brazil nut IgE class 11/28/2023 <0.10  0.00 - 0.34 kU/L Final   There may be more visits with results that are not included.          Assessment/Plan:     There are no diagnoses linked to this encounter.     Child bearing age: Check pregnancy test   Discussed pregnancy implications and advised to contact me as soon as she has any plans to conceive    Education: Reviewed how to properly take thyroid replacement. Instructed to take daily with water on an empty stomach without concomitant vitamins or calcium or  other medicine. Wait for 30-45 minutes before eating. If patient is confident they missed a day of pills, they can take the missed dose with their next tablet (only for levothyroxine).    Natural thyroid hormone use is not recommended by the American Thyroid Association. Natural thyroid hormone preparations consist of desiccated thyroid tissue from animals such as cows, sheep and pigs. These contain both thyroxine (T4) and triiodothyronine (T3) in a T4/T3 ratio of 4:1, the natural ratio made in animal thyroid glands.  Human thyroid glands, in contrast, make T4 and T3 in a ratio of 14:1. The liver and the kidneys contain a 5' deiodinase enzyme that converts T4 to T3 in appropriate and regulated amounts for the body's metabolic needs. Therefore, natural thyroid hormone preparations from animals provide significantly more T3 than humans naturally make. Since T3 is quickly and completely absorbed in the  small intestine, patients taking these natural preparations often have supraphysiological serum T3 levels for several hours after taking the medications. These high serum T3 levels are potentially dangerous, particularly in older individuals who may already have underlying heart disease and/or osteoporosis. For these reasons natural thyroid hormone use is discouraged by most experts in the field.      Hyperthyroidism medication education   'Reviewed the risks of anti-thyroid drugs including agranulocytosis and hepatitis. If the patient develops a fever, sore throat, jaundice, malaise and/or anorexia, patient is to stop the medicine and call ASAP.  Will do trial of methimazole for 9-12 months. If she has flare ups or continued methimazole for longer duration then will consider RAI or surgery.  Side effects of methimazole on fetus are cutis aplasia, congenital malformations, such as tracheoesophageal fistulas, patent vitellointestinal duct, choanal atresia, omphalocele, and omphalomesenteric duct anomaly.   Side effects of  PTU on fetus are like methimazole but involve head and neck more often.   Risk of miscarriage is twice with hyperthyroidism   Recent epidemiological data suggest very small increase risk of breast cancer in women 2-3 decades after exposure to RAI    2: Graves eye disease   Raise the head of the bed   Use sun glasses   Use OTC artifical tears 3-4 times per day  Take Selenium 200 mcg or two brazilian nuts per day   Counseled for smoking cessation      Thyroid nodules are common, present in 50% of women and 33% of men. Risk of cancer is generally < 10%    I have explained the thyroid FNA procedure. The patient should get more information at the time of procedure. FNA thyroid has False negative rates of about 3%. Non diagnostic rate of <10%. Will do genetic testing if needed.    Electronically signed by DEATRICE TAUNYA COWARD, MD on 02/10/2024 at 9:27 AM   "

## 2024-02-10 NOTE — Telephone Encounter (Signed)
"  Pt stated she had a new patient appointment scheduled for today 12/2. She stated she tried to call and reschedule last Thursday and Friday, she was advised that it was a holiday and our office was closed those days. Pt would like to know if she can reschedule her appointment.   Please advise  CB# [phone]  "

## 2024-02-10 NOTE — Telephone Encounter (Signed)
"  Linda King informed due to her NP appointment being a no show, she will be unable to schedule. I advised her to contact Dr. Emerson for further assistance.  "

## 2024-02-11 ENCOUNTER — Encounter: Payer: PRIVATE HEALTH INSURANCE | Attending: Physician Assistant | Primary: Family Medicine

## 2024-02-16 ENCOUNTER — Ambulatory Visit
Admit: 2024-02-16 | Discharge: 2024-02-16 | Payer: PRIVATE HEALTH INSURANCE | Attending: Physician Assistant | Primary: Family Medicine

## 2024-02-16 VITALS — Ht 63.0 in | Wt 133.0 lb

## 2024-02-16 DIAGNOSIS — G8929 Other chronic pain: Principal | ICD-10-CM

## 2024-02-16 MED ORDER — MELOXICAM 15 MG PO TABS
15 | ORAL_TABLET | Freq: Every day | ORAL | 0 refills | Status: AC | PRN
Start: 2024-02-16 — End: ?

## 2024-02-16 NOTE — Progress Notes (Signed)
 "   FOLLOW UP: SPINE    02/16/2024     CHIEF COMPLAINT:    Chief Complaint   Patient presents with    Results     MRI Thoracic Spine        HISTORY OF PRESENT ILLNESS:              The patient is a 48 y.o. female self-referred here to review thoracic MRI for chronic, worsening thoracic back pain.  She reports a 6+-year history of atraumatic aching midline thoracic back pain.  Her symptoms have been worsening over the last 1 year.  She also reports a history of chronic neck and trap discomfort with muscle tension.  Her thoracic back pain is most bothersome.  Her symptoms are increased with any prolonged activity and improved with resting and ice.  Conservative care includes 2 rounds of full PT with HEP, NSAIDs, muscle relaxers, Mobic  with some relief.  She denies any significant improvement at this time.  She does state PT is helpful while participating.  She denies any upper or lower extremity radiating pain.  She denies any radiating chest wall pain.  She denies any extremity numbness tingling or progressive weakness.  Denies any fine motor difficulty or gait instability.  Denies any recent injury or trauma.  Denies any progressive chest pain or dyspnea.    She had an evaluation through [facility], Dr. Ofilia    The pain assessment was noted & reviewed in the medical record today.     Current/Past Treatment:   Physical Therapy: Yes 2 full rounds with continued HEP  Chiropractic:     Injection:     Medications:            NSAIDS: Ibuprofen, Mobic  with some relief            Muscle relaxer: Yes            Steriods:              Neuropathic medications:              Opioids:            Other:   Surgery/Consult: no    Work Status/Functionality: Lawyer    Past Medical History: Medical history form was reviewed today & scanned into the media tab  No past medical history on file.   Past Surgical History:     Past Surgical History:   Procedure Laterality Date    HYSTERECTOMY, VAGINAL  12/18/2018      Current Medications:     Current Outpatient Medications:     meloxicam  (MOBIC ) 15 MG tablet, Take 1 tablet by mouth daily as needed for Pain, Disp: 30 tablet, Rfl: 0    estradiol (VIVELLE) 0.1 MG/24HR, APPLY 1 PATCH TO SKIN 2 TIMES WEEKLY., Disp: , Rfl:     Potassium 99 MG TABS, Take by mouth, Disp: , Rfl:     Magnesium 400 MG CAPS, Take by mouth, Disp: , Rfl:     Omega-3 Fatty Acids (OMEGA 3 PO), Take 1,250 mg by mouth, Disp: , Rfl:     Coenzyme Q10 (CO Q 10 PO), Take by mouth, Disp: , Rfl:     BIOTIN PO, Take 1,500 mg by mouth, Disp: , Rfl:     Ferrous Sulfate (IRON PO), Take 65 mg by mouth, Disp: , Rfl:     Zinc 30 MG TABS, Take by mouth, Disp: , Rfl:   Allergies:  Other  Social History:  reports that she has never smoked. She has never used smokeless tobacco. She reports current alcohol use of about 1.0 standard drink of alcohol per week. She reports that she does not use drugs.  Family History:   Family History   Problem Relation Age of Onset    Ovarian Cancer Sister     Ovarian Cancer Maternal Grandmother     Bone Cancer Paternal Grandmother     Breast Cancer Maternal Aunt     Heart Attack Maternal Uncle        REVIEW OF SYSTEMS: Full ROS reviewed & scanned into chart  CONSTITUTIONAL: Denies unexplained weight loss, fevers   SKIN: Denies active skin conditions        PHYSICAL EXAM:    Vitals: Height 1.6 m (5' 3), weight 60.3 kg (133 lb).  Pain score 3/10    GENERAL EXAM:  General Apparence: Patient is adequately groomed with no evidence of malnutrition.  Orientation: The patient is oriented to time, place and person.   Mood & Affect:The patient's mood and affect are appropriate  Lymphatic: The lymphatic examination bilaterally reveals all areas to be without enlargement or induration  Sensation: Sensation is intact without deficit  Coordination/Balance: Good coordination     CERVICAL EXAMINATION:  Inspection: Local inspection shows no step-off or bruising.  Cervical alignment is normal.     Palpation:  Positive tenderness to palpation upper midline thoracic spine, bilateral trap tightness  Range of Motion: Intact flexion mild loss extension intact lateral rotation  Strength: 5/5 bilateral upper extremities   Special Tests:      Spurling's, L'Hermitte's & Hoffman's negative bilaterally.      Skin:There are no rashes, ulcerations or lesions in right & left upper extremities.  Reflexes: Bilaterally triceps, biceps and brachioradialis are 1-2+.        Diagnostic Testing:   Thoracic MRI scan & report independently reviewed November 2025 is unremarkable.  No acute abnormality or compressive findings.  IMPRESSION:  Unremarkable MRI of the thoracic spine with no acute abnormality or compressive degenerative change.     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  Thoracic MRI is unremarkable without evidence of focal HNP or neural compression.  She has elected to pursue treatment course including  2) PT for above with myofascial release  3) Mobic  15mg  #30 i po qd PRN; informed to d/c other NSAIDs during. Take with food.  Side effect/risk discussed.  GI precaution.  4) Discussed concerning s/sx  5) Cont HEP  6) Referral to Dr. Dow, [place] if no progressive improvement with PT  7) Card with direct number provided; happy to see her back           Teruo Stilley Earnie Margo,  PA-C, MPAS  Board Certified by the [facility]      "

## 2024-02-18 ENCOUNTER — Inpatient Hospital Stay: Admit: 2024-02-18 | Discharge: 2024-02-18 | Payer: PRIVATE HEALTH INSURANCE | Primary: Family Medicine

## 2024-02-18 DIAGNOSIS — M546 Pain in thoracic spine: Principal | ICD-10-CM

## 2024-02-18 NOTE — Plan of Care (Signed)
 "     [facility] - Outpatient Rehabilitation and Therapy: [address], [address] [place] [address] office: [phone] fax: [phone]     Physical Therapy Initial Evaluation Certification      Dear Linda King, * ,    We had the pleasure of evaluating the following patient for physical therapy services at [facility] Outpatient Physical Therapy.  A summary of our findings can be found in the initial assessment below.  This includes our plan of care.  If you have any questions or concerns regarding these findings, please do not hesitate to contact me at the office phone number listed above.  Thank you for the referral.     Physician Signature:_______________________________Date:__________________  By signing above (or electronic signature), therapists plan is approved by physician       Physical Therapy: TREATMENT/PROGRESS NOTE   Patient: Linda King (48 y.o. 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, G89.29]  Chronic neck pain [M54.2, G89.29]  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]  Myofascial pain [M79.18]   Referring Physician: Margo Linda Earnie DEWAINE  PCP: Emerson Leach, MD     Plan of care signed (Y/N):     Date of Patient follow up with Physician:      Plan of Care Report: EVAL today  POC update due: (10 visits /OR AUTH LIMITS, whichever is less)  03/19/2024                                             Medical History:  Comorbidities:  Other:  migraines (has had for many many years)  Relevant Medical History: (-) heart history, (-) CA, (-) stroke, (-) seizure, (-) diabetes, (-) asthma or SOB                                           Precautions/ Contra-indications:           Latex allergy:  NO  Pacemaker:    NO  Contraindications for Manipulation: None  Date of Surgery: n/a  Other:    Red Flags:  None    Suicide Screening:   The patient did not verbalize a primary behavioral concern, suicidal ideation, suicidal intent, or demonstrate suicidal behaviors.    Preferred Language for Healthcare:   [x]  English       []  other:    SUBJECTIVE EXAMINATION     Patient stated complaint: Pt reports at least a 6 year hx of upper mid back pain. She has been dealing with it but lately she has been doing more activity and that causes increased pain. Pt saw Linda King, [place] who recommended PT, chiropractor,  and referral to Dr. Ofilia.        Test used Initial score  02/18/24 02/18/2024   Pain Summary VAS 2-8/10    Functional questionnaire Neck Disability Index NV    Other:              Pain:  Pain location: upper mid back, neck, shoulders  Patient describes pain to be constant, aching, numbness, and tingling  Pain decreases with: laying down with ice, sitting down, taking it easy  Pain increases with: sleeping (if pillow is in the wrong spot), repetitive UE movement, lifting     Living status: independently    Current Functional Limitations:    Functional Complaints:  sleeping, all activity      PLOF:  Pre-existing functional limitations include has been going on like this for the last 6 years or so  Pt's sleep is affected?   YES    Occupation/School:  Work/School Status: part time lawyer, seasonal worker for [employer]  Job Duties/Demands: Prolonged Standing  Heavy lifting  Moderate lifting  Repetitive lifting  Walking, climbing on ladder    Hand Dominance: Right    Sport/ Recreation/ Leisure/ Hobbies: TM, strengthening videos (x2/wk)    Review Of Systems  (ROS):  [x]  Performed Review of systems (Integumentary, CardioPulmonary, Neurological) by intake and observation. Intake form is in the medical record. Patient has been instructed to contact their primary care physician regarding ROS issues if not already being addressed at this time.    [x]  Patient history, allergies, meds reviewed. Medical chart reviewed. See intake form.     OBJECTIVE EXAMINATION     02/18/24  ROM/Strength: (Blank cells denote NT)      Mvmt (norm) AROM L AROM R PROM L PROM R Notes MMT L MMT R Notes     CERVICAL Flex (60) 40          Ext (70) 40  p!        SB(45) 20   30            Rotation (80)               SHOULDER Flexion (180) 180   180       4+/5 4+/5     Abduction (180) 180  p! 180  p!    4/5 p! 4/5 p!     ER -0            ER -90 (90)      4+/5 4+/5     IR -0      5/5 5/5     IR -90 (70)             ELBOW Flex/biceps (140)            Ext/triceps (0)            Pronation (80)            Supination (80)               WRIST Flexion (60)            Extension (60)            RD (20)            UD (20)            Grip N/a N/a N/a N/a               DATE ASSESSED   PALPATION Patient reported tenderness with palpation  Location: along thoracic spine, upper traps, levator scap bilt, cervical parapsinals   12/10   POSTURE rounded shoulders and flattened thoracic spine   12/10   BANDAGES/INCISIONS Not Applicable   12/10   DERMATOMES None, all WNL   12/10   MYOTOMES None, all WNL   12/10   REFLEXES  All reflexes WNL (2+)   12/10   SPECIFIC JOINT MOBLITY Cervical: hypomobility of C4 C5 C6 C7     12/10   GAIT Not Observed   12/10   SPECIAL TESTS  NT   12/10   BALANCE WNL   12/10       Exercises/Interventions     Therapeutic Ex (97110)  resistance Sets/time Reps Notes/Cues/Progressions   SB ball roll fwd, R/L  1 10    Supine SA punch              Bilat ER  NV     Supine horiz ABD  NV     Single arm pec S  NV     Standing open book at wall  NV                          Manual Intervention (97140)   TIME                                        NMR re-education (02887) resistance Sets/time Reps CUES NEEDED                                      Therapeutic Activity (97530)  Sets/time                                          Modalities:    Cold Pack    Education/Home Exercise Program: Patient HEP program created electronically.  Refer to Medbridge access code: [email or web address]       ASSESSMENT   Assessment:   Fedra Lanter is a 48 y.o. female presenting today to Outpatient PT with signs and symptoms consistent with chronic thoracic and neck pain.    Pt. presents with the functional impairments and activity limitations listed below and would benefit from Outpatient PT to address the below impairments as well as improve pain, and restore function.     Functional Impairments:   Noted cervical/thoracic/Glenohumeral joint hypomobility  Decreased cervical/UE functional ROM  Decreased Rotator Cuff/Scapular/Core strength and neuromuscular control  Decreased UE functional strength  Noted Headache pain aggravated by neck movements without dizziness    Functional Activity Limitations (from functional questionnaire and intake):   Any of your usual work, housework, or school activities  Lifting a bag of groceries to waist level  Grooming your hair  Preparing food (eg, peeling, cutting)  Vacuuming, sweeping or raking  Dressing  Using tools or appliances  Cleaning  Sleeping  Carrying a small suitcase with your affected limb  Reaching up into a cabinet     Participation Restrictions:   Reduced participation in self care  Reduced participation in home management   Reduced participation in work activities    Classification :   Signs/symptoms consistent with neck pain with mobility deficits    Signs/symptoms consistent with neck pain with  headaches (cervicogenic)   Signs/symptoms consistent with facet dysfunction of cervical and thoracic spine  Signs/symptoms consistent with postural dysfunction       Barriers to/and or personal factors that  will affect rehab potential:   Profession/work barriers    Physical Therapy Evaluation Complexity Justification  [x]  A history of present problem and no personal factors and/or co-morbidities that impact the plan of care [Low]  [x]  A total of 3 elements [Mod] found upon examination of body systems using standardized tests and measures addressing any of the following: body structures, functions (impairments), activity limitations, and/or participation restrictions  [x]  A clinical presentation with stable and/or uncomplicated characteristics [Low]  [x]  Clinical decision making of LOW (97161 - Typically 20 minutes face-to-face) complexity using standardized patient assessment instrument and/or measurable assessment of functional outcome.    Today's Assessment:   See above    Medical Necessity Documentation:  I certify that this patient meets the below criteria necessary for medical necessity for care and/or justification of therapy services:  The patient has functional impairments and/or activity limitations and would benefit from continued outpatient therapy services to address the deficits outlined in the patients goals    Return to Play: NA    Prognosis for POC: [x]  Good []  Fair  []  Poor    Patient requires continued skilled intervention: [x]  Yes  []  No      CHARGE CAPTURE     PT CHARGE GRID   CPT Code (TIMED) minutes # CPT Code (UNTIMED) #     Therex (97110)  20 1  EVAL:LOW (97161 - Typically 20 minutes face-to-face) 1    Neuromusc. Re-ed (02887)    Re-Eval (02835)     Manual (97140)    Estim Unattended (97014)     Ther. Act (02469)    Mech. Traction [phone])     Gait [phone])    Dry Needle 1-2 muscle (79439)     Aquatic Therex [phone])    Dry Needle 3+ muscle (79438)     Iontophoresis (02966)    VASO (02983)     Ultrasound (02964)    Group Therapy (02849)     Estim Attended (02967)    Canalith Repositioning (04007)     Physical Performance Test [phone])    Custom orthotic (O6979)     Other:    Other:    Total Timed  Code Tx Minutes 20 1  20      Total Treatment Minutes 50        Charge Justification:  (02889) THERAPEUTIC EXERCISE - Provided verbal/tactile cueing for HEP and/or activities related to strengthening, flexibility, endurance, ROM performed to prevent loss of range of motion, maintain or improve muscular strength or increase flexibility, following either an injury or surgery.     GOALS     Patient stated goal: be able to work without constant pain  []  Progressing: []  Met: []  Not Met: []  Adjusted    Therapist goals for Patient:   Short Term Goals: To be achieved in: 2 weeks  Independent in HEP and progression per patient tolerance, in order to prevent re-injury.   []  Progressing: []  Met: []  Not Met: []  Adjusted  Patient will have a decrease in pain to <2/10 to facilitate improvement in movement, function, and ADLs as indicated by Functional Deficits.  []  Progressing: []  Met: []  Not Met: []  Adjusted    IF APPLICABLE:  []  Patient to demonstrate independence in wear and care for custom orthotic device. (Only if applicable for orthotic eval)  Long Term Goals: To be achieved in: 12 weeks  Disability index score of 10% or less for the Neck Disability Index to assist with reaching prior level of function.  []  Progressing: []  Met: []  Not Met: []  Adjusted  Patient will demonstrate increased AROM of cervical spine to [facility] without pain to allow for proper joint functioning to enable patient to drive safely.   []  Progressing: []  Met: []  Not Met: []  Adjusted  Patient will demonstrate increased Strength of shoulder and postural muscles   to at least 5/5 throughout without pain to allow for proper functional mobility to enable patient to return to lifting to waist level with no ? pain.   []  Progressing: []  Met: []  Not Met: []  Adjusted  Patient will return to ADl without increased symptoms or restriction.   []  Progressing: []  Met: []  Not Met: []  Adjusted  Pt will report no disruptions in sleep due to pain. (patient specific  functional goal)    []  Progressing: []  Met: []  Not Met: []  Adjusted        Overall Progression Towards Functional goals/ Treatment Progress Update:  []  Patient is progressing as expected towards functional goals listed.    []  Progression is slowed due to complexities/Impairments listed.  []  Progression has been slowed due to co-morbidities.  [x]  Plan just implemented, too soon (<30days) to assess goals progression   []  Goals require adjustment due to lack of progress  []  Patient is not progressing as expected and requires additional follow up with physician  []  Other:     TREATMENT PLAN     Frequency/Duration: 1-2x/week for 12 weeks for the following treatment interventions:    Interventions:  Therapeutic Exercise (97110) including: strength training, ROM, and functional mobility  Therapeutic Activities (97530) including: functional mobility training and education.  Neuromuscular Re-education [phone]) activation and proprioception, including postural re-education.    Manual Therapy [phone]) as indicated to include: Passive Range of Motion, Gr I-IV mobilizations, Trigger Point Release, and Myofascial Release  Modalities as needed that may include: Cryotherapy, Electrical Stimulation, and Thermal Agents  Patient education on postural re-education, activity modification, and progression of HEP    Plan: POC initiated as per evaluation    Electronically Signed by Reche KATHEE Ganser, PT  Date: 02/18/2024   Note: Portions of this note have been templated and/or copied from initial evaluation, reassessments and prior notes for documentation efficiency.  Note: If patient does not return for scheduled/recommended follow up visits, this note will serve as a discharge from care along with the most recent update on progress.    Ortho Evaluation     "

## 2024-03-08 ENCOUNTER — Encounter: Payer: PRIVATE HEALTH INSURANCE | Primary: Family Medicine

## 2024-03-10 ENCOUNTER — Encounter: Payer: PRIVATE HEALTH INSURANCE | Primary: Family Medicine

## 2024-03-17 ENCOUNTER — Encounter: Payer: PRIVATE HEALTH INSURANCE | Primary: Family Medicine
